# Patient Record
Sex: Male | Born: 1970 | Race: Black or African American | Hispanic: No | Marital: Married | Smoking: Never smoker
Health system: Southern US, Community
[De-identification: ages and names within clinical notes are randomized; demographics above are authoritative.]

## PROBLEM LIST (undated history)

## (undated) DIAGNOSIS — E7800 Pure hypercholesterolemia, unspecified: Secondary | ICD-10-CM

## (undated) HISTORY — PX: VASECTOMY: SHX75

---

## 2001-05-04 ENCOUNTER — Ambulatory Visit (HOSPITAL_COMMUNITY): Admission: RE | Admit: 2001-05-04 | Discharge: 2001-05-04 | Payer: Self-pay | Admitting: Urology

## 2001-12-22 ENCOUNTER — Encounter: Payer: Self-pay | Admitting: Family Medicine

## 2001-12-22 ENCOUNTER — Ambulatory Visit (HOSPITAL_COMMUNITY): Admission: RE | Admit: 2001-12-22 | Discharge: 2001-12-22 | Payer: Self-pay | Admitting: Family Medicine

## 2001-12-28 ENCOUNTER — Encounter: Payer: Self-pay | Admitting: Family Medicine

## 2001-12-28 ENCOUNTER — Ambulatory Visit (HOSPITAL_COMMUNITY): Admission: RE | Admit: 2001-12-28 | Discharge: 2001-12-28 | Payer: Self-pay | Admitting: Family Medicine

## 2002-02-21 ENCOUNTER — Ambulatory Visit (HOSPITAL_COMMUNITY): Admission: RE | Admit: 2002-02-21 | Discharge: 2002-02-21 | Payer: Self-pay | Admitting: Family Medicine

## 2002-02-21 ENCOUNTER — Encounter: Payer: Self-pay | Admitting: Family Medicine

## 2002-10-02 ENCOUNTER — Encounter: Payer: Self-pay | Admitting: Emergency Medicine

## 2002-10-02 ENCOUNTER — Emergency Department (HOSPITAL_COMMUNITY): Admission: EM | Admit: 2002-10-02 | Discharge: 2002-10-02 | Payer: Self-pay | Admitting: Emergency Medicine

## 2003-11-21 ENCOUNTER — Emergency Department (HOSPITAL_COMMUNITY): Admission: EM | Admit: 2003-11-21 | Discharge: 2003-11-21 | Payer: Self-pay | Admitting: Emergency Medicine

## 2004-02-03 ENCOUNTER — Ambulatory Visit (HOSPITAL_COMMUNITY): Admission: RE | Admit: 2004-02-03 | Discharge: 2004-02-03 | Payer: Self-pay | Admitting: Family Medicine

## 2004-09-03 ENCOUNTER — Ambulatory Visit (HOSPITAL_COMMUNITY): Admission: RE | Admit: 2004-09-03 | Discharge: 2004-09-03 | Payer: Self-pay | Admitting: Family Medicine

## 2005-02-08 ENCOUNTER — Ambulatory Visit (HOSPITAL_COMMUNITY): Admission: RE | Admit: 2005-02-08 | Discharge: 2005-02-08 | Payer: Self-pay | Admitting: Family Medicine

## 2009-01-26 ENCOUNTER — Ambulatory Visit (HOSPITAL_COMMUNITY): Admission: RE | Admit: 2009-01-26 | Discharge: 2009-01-26 | Payer: Self-pay | Admitting: Internal Medicine

## 2009-03-05 ENCOUNTER — Ambulatory Visit (HOSPITAL_COMMUNITY): Admission: RE | Admit: 2009-03-05 | Discharge: 2009-03-05 | Payer: Self-pay | Admitting: Family Medicine

## 2012-07-17 ENCOUNTER — Other Ambulatory Visit (HOSPITAL_COMMUNITY): Payer: Self-pay | Admitting: Family Medicine

## 2012-07-17 DIAGNOSIS — M545 Low back pain, unspecified: Secondary | ICD-10-CM

## 2012-07-19 ENCOUNTER — Ambulatory Visit (HOSPITAL_COMMUNITY)
Admission: RE | Admit: 2012-07-19 | Discharge: 2012-07-19 | Disposition: A | Discharge status: Home / Self Care | Payer: BC Managed Care – PPO | Source: Ambulatory Visit | Attending: Family Medicine | Admitting: Family Medicine

## 2012-07-19 DIAGNOSIS — M545 Low back pain, unspecified: Secondary | ICD-10-CM | POA: Insufficient documentation

## 2012-07-19 DIAGNOSIS — M51379 Other intervertebral disc degeneration, lumbosacral region without mention of lumbar back pain or lower extremity pain: Secondary | ICD-10-CM | POA: Insufficient documentation

## 2012-07-19 DIAGNOSIS — M5137 Other intervertebral disc degeneration, lumbosacral region: Secondary | ICD-10-CM | POA: Insufficient documentation

## 2013-08-09 ENCOUNTER — Encounter (HOSPITAL_COMMUNITY): Payer: Self-pay

## 2013-08-09 ENCOUNTER — Encounter (HOSPITAL_COMMUNITY)
Admission: RE | Admit: 2013-08-09 | Discharge: 2013-08-09 | Disposition: A | Discharge status: Home / Self Care | Payer: BC Managed Care – PPO | Source: Ambulatory Visit | Attending: General Surgery | Admitting: General Surgery

## 2013-08-09 ENCOUNTER — Encounter (HOSPITAL_COMMUNITY): Payer: Self-pay | Admitting: Pharmacy Technician

## 2013-08-09 DIAGNOSIS — Z01812 Encounter for preprocedural laboratory examination: Secondary | ICD-10-CM | POA: Insufficient documentation

## 2013-08-09 HISTORY — DX: Pure hypercholesterolemia, unspecified: E78.00

## 2013-08-09 LAB — CBC WITH DIFFERENTIAL/PLATELET
Eosinophils Absolute: 0.1 10*3/uL (ref 0.0–0.7)
Eosinophils Relative: 1 % (ref 0–5)
Lymphs Abs: 2 10*3/uL (ref 0.7–4.0)
MCV: 88.2 fL (ref 78.0–100.0)
Monocytes Absolute: 0.4 10*3/uL (ref 0.1–1.0)
Monocytes Relative: 7 % (ref 3–12)
RBC: 4.98 MIL/uL (ref 4.22–5.81)

## 2013-08-09 LAB — BASIC METABOLIC PANEL
BUN: 12 mg/dL (ref 6–23)
Chloride: 100 mEq/L (ref 96–112)
GFR calc Af Amer: >90 mL/min (ref >90)
Potassium: 3.8 mEq/L (ref 3.5–5.1)
Sodium: 137 mEq/L (ref 135–145)

## 2013-08-09 NOTE — Patient Instructions (Addendum)
MATHEW POSTIGLIONE  08/09/2013   Your procedure is scheduled on:  08/16/2013   Report to [facility] at  615  AM.  Call this number if you have problems the morning of surgery: [phone]   Remember:   Do not eat food or drink liquids after midnight.   Take these medicines the morning of surgery with A SIP OF WATER: none   Do not wear jewelry, make-up or nail polish.  Do not wear lotions, powders, or perfumes.  Do not shave 48 hours prior to surgery. Men may shave face and neck.  Do not bring valuables to the hospital.  [facility] is not responsible  for any belongings or valuables.  Contacts, dentures or bridgework may not be worn into surgery.  Leave suitcase in the car. After surgery it may be brought to your room.  For patients admitted to the hospital, checkout time is 11:00 AM the day of discharge.   Patients discharged the day of surgery will not be allowed to drive home.  Name and phone number of your driver: family  Special Instructions: Shower using CHG 2 nights before surgery and the night before surgery.  If you shower the day of surgery use CHG.  Use special wash - you have one bottle of CHG for all showers.  You should use approximately 1/3 of the bottle for each shower.   Please read over the following fact sheets that you were given: Pain Booklet, Coughing and Deep Breathing, Surgical Site Infection Prevention, Anesthesia Post-op Instructions and Care and Recovery After Surgery Inguinal Hernia, Adult Muscles help keep everything in the body in its proper place. But if a weak spot in the muscles develops, something can poke through. That is called a hernia. When this happens in the lower part of the belly (abdomen), it is called an inguinal hernia. (It takes its name from a part of the body in this region called the inguinal canal.) A weak spot in the wall of muscles lets some fat or part of the small intestine bulge through. An inguinal hernia can develop at any age. Men  get them more often than women. CAUSES  In adults, an inguinal hernia develops over time.  It can be triggered by:  Suddenly straining the muscles of the lower abdomen.  Lifting heavy objects.  Straining to have a bowel movement. Difficult bowel movements (constipation) can lead to this.  Constant coughing. This may be caused by smoking or lung disease.  Being overweight.  Being pregnant.  Working at a job that requires long periods of standing or heavy lifting.  Having had an inguinal hernia before. One type can be an emergency situation. It is called a strangulated inguinal hernia. It develops if part of the small intestine slips through the weak spot and cannot get back into the abdomen. The blood supply can be cut off. If that happens, part of the intestine may die. This situation requires emergency surgery. SYMPTOMS  Often, a small inguinal hernia has no symptoms. It is found when a healthcare provider does a physical exam. Larger hernias usually have symptoms.   In adults, symptoms may include:  A lump in the groin. This is easier to see when the person is standing. It might disappear when lying down.  In men, a lump in the scrotum.  Pain or burning in the groin. This occurs especially when lifting, straining or coughing.  A dull ache or feeling of pressure in the  groin.  Signs of a strangulated hernia can include:  A bulge in the groin that becomes very painful and tender to the touch.  A bulge that turns red or purple.  Fever, nausea and vomiting.  Inability to have a bowel movement or to pass gas. DIAGNOSIS  To decide if you have an inguinal hernia, a healthcare provider will probably do a physical examination.  This will include asking questions about any symptoms you have noticed.  The healthcare provider might feel the groin area and ask you to cough. If an inguinal hernia is felt, the healthcare provider may try to slide it back into the  abdomen.  Usually no other tests are needed. TREATMENT  Treatments can vary. The size of the hernia makes a difference. Options include:  Watchful waiting. This is often suggested if the hernia is small and you have had no symptoms.  No medical procedure will be done unless symptoms develop.  You will need to watch closely for symptoms. If any occur, contact your healthcare provider right away.  Surgery. This is used if the hernia is larger or you have symptoms.  Open surgery. This is usually an outpatient procedure (you will not stay overnight in a hospital). An cut (incision) is made through the skin in the groin. The hernia is put back inside the abdomen. The weak area in the muscles is then repaired by herniorrhaphy or hernioplasty. Herniorrhaphy: in this type of surgery, the weak muscles are sewn back together. Hernioplasty: a patch or mesh is used to close the weak area in the abdominal wall.  Laparoscopy. In this procedure, a surgeon makes small incisions. A thin tube with a tiny video camera (called a laparoscope) is put into the abdomen. The surgeon repairs the hernia with mesh by looking with the video camera and using two long instruments. HOME CARE INSTRUCTIONS   After surgery to repair an inguinal hernia:  You will need to take pain medicine prescribed by your healthcare provider. Follow all directions carefully.  You will need to take care of the wound from the incision.  Your activity will be restricted for awhile. This will probably include no heavy lifting for several weeks. You also should not do anything too active for a few weeks. When you can return to work will depend on the type of job that you have.  During "watchful waiting" periods, you should:  Maintain a healthy weight.  Eat a diet high in fiber (fruits, vegetables and whole grains).  Drink plenty of fluids to avoid constipation. This means drinking enough water and other liquids to keep your urine clear  or pale yellow.  Do not lift heavy objects.  Do not stand for long periods of time.  Quit smoking. This should keep you from developing a frequent cough. SEEK MEDICAL CARE IF:   A bulge develops in your groin area.  You feel pain, a burning sensation or pressure in the groin. This might be worse if you are lifting or straining.  You develop a fever of more than 100.5 F (38.1 C). SEEK IMMEDIATE MEDICAL CARE IF:   Pain in the groin increases suddenly.  A bulge in the groin gets bigger suddenly and does not go down.  For men, there is sudden pain in the scrotum. Or, the size of the scrotum increases.  A bulge in the groin area becomes red or purple and is painful to touch.  You have nausea or vomiting that does not go away.  You  feel your heart beating much faster than normal.  You cannot have a bowel movement or pass gas.  You develop a fever of more than 102.0 F (38.9 C). Document Released: 04/23/2009 Document Revised: 02/27/2012 Document Reviewed: 04/23/2009 [facility] Patient Information 2014 [place], [place]. PATIENT INSTRUCTIONS POST-ANESTHESIA  IMMEDIATELY FOLLOWING SURGERY:  Do not drive or operate machinery for the first twenty four hours after surgery.  Do not make any important decisions for twenty four hours after surgery or while taking narcotic pain medications or sedatives.  If you develop intractable nausea and vomiting or a severe headache please notify your doctor immediately.  FOLLOW-UP:  Please make an appointment with your surgeon as instructed. You do not need to follow up with anesthesia unless specifically instructed to do so.  WOUND CARE INSTRUCTIONS (if applicable):  Keep a dry clean dressing on the anesthesia/puncture wound site if there is drainage.  Once the wound has quit draining you may leave it open to air.  Generally you should leave the bandage intact for twenty four hours unless there is drainage.  If the epidural site drains for more than  36-48 hours please call the anesthesia department.  QUESTIONS?:  Please feel free to call your physician or the hospital operator if you have any questions, and they will be happy to assist you.

## 2013-08-15 NOTE — H&P (Signed)
  NTS SOAP Note  Vital Signs:  Vitals as of: 07/16/2013: Systolic 138: Diastolic 79: Heart Rate 54: Temp 97.35F: Height 25ft 0in: Weight 237Lbs 0 Ounces: BMI 32.14  BMI : 32.14 kg/m2  Subjective: This 42 Years 5 Months old Male presents for of  right groin bulge  and pain. Patient noted similar symptoms over the last year. It has slowly increased in size. No signs or symptoms of incarceration or strangulation the  Review of Symptoms:  Constitutional:unremarkable   Head:unremarkable    Eyes:unremarkable   Nose/Mouth/Throat:unremarkable Cardiovascular:  unremarkable   Respiratory:unremarkable   Gastrointestinal:  unremarkable   as per history of present illness Genitourinary:unremarkable     Musculoskeletal:unremarkable   Skin:unremarkable Breast:unremarkable   Hematolgic/Lymphatic:unremarkable     Allergic/Immunologic:unremarkable     Past Medical History:  Obtained     Past Medical History  Surgical History: none Medical Problems: none Psychiatric History: none Allergies: no known drug allergies Medications: none   Social History:Obtained  Social History  Preferred Language: English Race:  Black or African American Ethnicity: Not Hispanic / Latino Age: 42 Years 11 Months Marital Status:  M Alcohol: none Recreational drug(s): none   Smoking Status: Never smoker reviewed on 07/16/2013 Functional Status reviewed on mm/dd/yyyy ------------------------------------------------ Bathing: Normal Cooking: Normal Dressing: Normal Driving: Normal Eating: Normal Managing Meds: Normal Oral Care: Normal Shopping: Normal Toileting: Normal Transferring: Normal Walking: Normal Cognitive Status reviewed on mm/dd/yyyy ------------------------------------------------ Attention: Normal Decision Making: Normal Language: Normal Memory: Normal Motor: Normal Perception: Normal Problem Solving: Normal Visual and Spatial:  Normal   Family History:Obtained    Family Health History Mother  Father  Other Family Member, Living; Healthy; noncontributory    Objective Information: General:  Well appearing, well nourished in no distress. Skin:     no rash or prominent lesions Head:Atraumatic; no masses; no abnormalities Eyes:  conjunctiva clear, EOM intact, PERRL Mouth:  Mucous membranes moist, no mucosal lesions. Neck:  Supple without lymphadenopathy.  Heart:  RRR, no murmur Lungs:    CTA bilaterally, no wheezes, rhonchi, rales.  Breathing unlabored. Abdomen:Soft, NT/ND, no HSM, no masses. reducible right inguinal hernia. Some laxity in the left groin.  Assessment:    Plan:  Right inguinal hernia. Risks benefits alternatives of repair were discussed at length the patient. He will schedule at his convenience. Signs and symptoms of incarceration and strangulation were discussed the patient is aware to proceed to the emergency department should these occur.  Patient Education:Alternative treatments to surgery were discussed with patient (and family).  Risks and benefits  of procedure were fully explained to the patient (and family) who gave informed consent. Patient/family questions were addressed.  Follow-up:Pending Surgery,PRN

## 2013-08-16 ENCOUNTER — Ambulatory Visit (HOSPITAL_COMMUNITY)
Admission: RE | Admit: 2013-08-16 | Discharge: 2013-08-16 | Disposition: A | Discharge status: Home / Self Care | Payer: BC Managed Care – PPO | Source: Ambulatory Visit | Attending: General Surgery | Admitting: General Surgery

## 2013-08-16 ENCOUNTER — Encounter (HOSPITAL_COMMUNITY)
Admission: RE | Disposition: A | Discharge status: Home / Self Care | Payer: Self-pay | Source: Ambulatory Visit | Attending: General Surgery

## 2013-08-16 ENCOUNTER — Encounter (HOSPITAL_COMMUNITY): Payer: Self-pay | Admitting: Anesthesiology

## 2013-08-16 ENCOUNTER — Encounter (HOSPITAL_COMMUNITY): Payer: Self-pay | Admitting: *Deleted

## 2013-08-16 ENCOUNTER — Ambulatory Visit (HOSPITAL_COMMUNITY): Payer: BC Managed Care – PPO | Admitting: Anesthesiology

## 2013-08-16 DIAGNOSIS — K4090 Unilateral inguinal hernia, without obstruction or gangrene, not specified as recurrent: Secondary | ICD-10-CM | POA: Insufficient documentation

## 2013-08-16 DIAGNOSIS — Z6832 Body mass index (BMI) 32.0-32.9, adult: Secondary | ICD-10-CM | POA: Insufficient documentation

## 2013-08-16 HISTORY — PX: INGUINAL HERNIA REPAIR: SHX194

## 2013-08-16 SURGERY — REPAIR, HERNIA, INGUINAL, ADULT
Anesthesia: General | Site: Groin | Laterality: Right | Wound class: Clean

## 2013-08-16 MED ORDER — PROPOFOL 10 MG/ML IV EMUL
INTRAVENOUS | Status: AC
  Filled 2013-08-16: qty 20

## 2013-08-16 MED ORDER — LACTATED RINGERS IV SOLN
INTRAVENOUS | Status: DC
  Administered 2013-08-16: 07:00:00 via INTRAVENOUS

## 2013-08-16 MED ORDER — CELECOXIB 100 MG PO CAPS
400.0000 mg | ORAL_CAPSULE | Freq: Every day | ORAL | Status: AC
  Administered 2013-08-16: 400 mg via ORAL

## 2013-08-16 MED ORDER — MIDAZOLAM HCL 2 MG/2ML IJ SOLN
INTRAMUSCULAR | Status: AC
  Filled 2013-08-16: qty 2

## 2013-08-16 MED ORDER — FENTANYL CITRATE 0.05 MG/ML IJ SOLN
INTRAMUSCULAR | Status: DC | PRN
  Administered 2013-08-16: 25 ug via INTRAVENOUS
  Administered 2013-08-16 (×4): 50 ug via INTRAVENOUS
  Administered 2013-08-16: 25 ug via INTRAVENOUS

## 2013-08-16 MED ORDER — FENTANYL CITRATE 0.05 MG/ML IJ SOLN
25.0000 ug | INTRAMUSCULAR | Status: DC | PRN
  Administered 2013-08-16 (×2): 50 ug via INTRAVENOUS

## 2013-08-16 MED ORDER — CEFAZOLIN SODIUM-DEXTROSE 2-3 GM-% IV SOLR
INTRAVENOUS | Status: AC
  Filled 2013-08-16: qty 50

## 2013-08-16 MED ORDER — MEPERIDINE HCL 50 MG/ML IJ SOLN
INTRAMUSCULAR | Status: AC
  Administered 2013-08-16: 12.5 mg
  Filled 2013-08-16: qty 1

## 2013-08-16 MED ORDER — ONDANSETRON HCL 4 MG/2ML IJ SOLN
4.0000 mg | Freq: Once | INTRAMUSCULAR | Status: AC
  Administered 2013-08-16: 4 mg via INTRAVENOUS

## 2013-08-16 MED ORDER — MEPERIDINE HCL 25 MG/ML IJ SOLN: 12.5000 mg | Freq: Once | INTRAMUSCULAR | Status: AC

## 2013-08-16 MED ORDER — ENOXAPARIN SODIUM 40 MG/0.4ML ~~LOC~~ SOLN
40.0000 mg | Freq: Once | SUBCUTANEOUS | Status: AC
  Administered 2013-08-16: 40 mg via SUBCUTANEOUS

## 2013-08-16 MED ORDER — LIDOCAINE HCL 2 % EX GEL
CUTANEOUS | Status: AC
  Filled 2013-08-16: qty 30

## 2013-08-16 MED ORDER — SODIUM CHLORIDE 0.9 % IR SOLN
Status: DC | PRN
  Administered 2013-08-16: 1000 mL

## 2013-08-16 MED ORDER — MIDAZOLAM HCL 2 MG/2ML IJ SOLN
1.0000 mg | INTRAMUSCULAR | Status: DC | PRN
  Administered 2013-08-16: 2 mg via INTRAVENOUS

## 2013-08-16 MED ORDER — CELECOXIB 100 MG PO CAPS
ORAL_CAPSULE | ORAL | Status: AC
  Filled 2013-08-16: qty 4

## 2013-08-16 MED ORDER — ENOXAPARIN SODIUM 40 MG/0.4ML ~~LOC~~ SOLN
SUBCUTANEOUS | Status: AC
  Filled 2013-08-16: qty 0.4

## 2013-08-16 MED ORDER — PROPOFOL 10 MG/ML IV BOLUS
INTRAVENOUS | Status: DC | PRN
  Administered 2013-08-16: 200 mg via INTRAVENOUS

## 2013-08-16 MED ORDER — BUPIVACAINE HCL (PF) 0.5 % IJ SOLN
INTRAMUSCULAR | Status: AC
  Filled 2013-08-16: qty 30

## 2013-08-16 MED ORDER — HYDROCODONE-ACETAMINOPHEN 5-325 MG PO TABS: 1.0000 | ORAL_TABLET | ORAL | Status: DC | PRN

## 2013-08-16 MED ORDER — CHLORHEXIDINE GLUCONATE 4 % EX LIQD: 1.0000 "application " | Freq: Once | CUTANEOUS | Status: DC

## 2013-08-16 MED ORDER — FENTANYL CITRATE 0.05 MG/ML IJ SOLN
INTRAMUSCULAR | Status: AC
  Filled 2013-08-16: qty 5

## 2013-08-16 MED ORDER — LIDOCAINE HCL (CARDIAC) 20 MG/ML IV SOLN
INTRAVENOUS | Status: DC | PRN
  Administered 2013-08-16: 30 mg via INTRAVENOUS

## 2013-08-16 MED ORDER — FENTANYL CITRATE 0.05 MG/ML IJ SOLN
INTRAMUSCULAR | Status: AC
  Filled 2013-08-16: qty 2

## 2013-08-16 MED ORDER — ONDANSETRON HCL 4 MG/2ML IJ SOLN: 4.0000 mg | Freq: Once | INTRAMUSCULAR | Status: DC | PRN

## 2013-08-16 MED ORDER — BUPIVACAINE HCL (PF) 0.5 % IJ SOLN
INTRAMUSCULAR | Status: DC | PRN
  Administered 2013-08-16: 10 mL

## 2013-08-16 MED ORDER — CEFAZOLIN SODIUM-DEXTROSE 2-3 GM-% IV SOLR
2.0000 g | INTRAVENOUS | Status: AC
  Administered 2013-08-16: 2 g via INTRAVENOUS

## 2013-08-16 MED ORDER — ONDANSETRON HCL 4 MG/2ML IJ SOLN
INTRAMUSCULAR | Status: AC
  Filled 2013-08-16: qty 2

## 2013-08-16 SURGICAL SUPPLY — 38 items
APL SKNCLS STERI-STRIP NONHPOA (GAUZE/BANDAGES/DRESSINGS) ×1
BAG HAMPER (MISCELLANEOUS) ×2 IMPLANT
BENZOIN TINCTURE PRP APPL 2/3 (GAUZE/BANDAGES/DRESSINGS) ×2 IMPLANT
CLOTH BEACON ORANGE TIMEOUT ST (SAFETY) ×2 IMPLANT
COVER LIGHT HANDLE STERIS (MISCELLANEOUS) ×4 IMPLANT
DECANTER SPIKE VIAL GLASS SM (MISCELLANEOUS) ×2 IMPLANT
DRAIN PENROSE 18X.75 LTX STRL (MISCELLANEOUS) ×2 IMPLANT
DURAPREP 26ML APPLICATOR (WOUND CARE) ×2 IMPLANT
ELECT REM PT RETURN 9FT ADLT (ELECTROSURGICAL) ×2
ELECTRODE REM PT RTRN 9FT ADLT (ELECTROSURGICAL) ×1 IMPLANT
FORMALIN 10 PREFIL 120ML (MISCELLANEOUS) ×2 IMPLANT
GLOVE BIOGEL PI IND STRL 7.0 (GLOVE) ×3 IMPLANT
GLOVE BIOGEL PI IND STRL 7.5 (GLOVE) ×1 IMPLANT
GLOVE BIOGEL PI IND STRL 8 (GLOVE) ×1 IMPLANT
GLOVE BIOGEL PI INDICATOR 7.0 (GLOVE) ×3
GLOVE BIOGEL PI INDICATOR 7.5 (GLOVE) ×1
GLOVE BIOGEL PI INDICATOR 8 (GLOVE) ×1
GLOVE ECLIPSE 7.0 STRL STRAW (GLOVE) ×4 IMPLANT
GLOVE EXAM NITRILE LRG STRL (GLOVE) ×2 IMPLANT
GOWN STRL REIN XL XLG (GOWN DISPOSABLE) ×8 IMPLANT
INST SET MINOR GENERAL (KITS) ×2 IMPLANT
KIT ROOM TURNOVER APOR (KITS) ×2 IMPLANT
MANIFOLD NEPTUNE II (INSTRUMENTS) ×2 IMPLANT
MESH HERNIA 1.6X1.9 PLUG LRG (Mesh General) ×1 IMPLANT
MESH HERNIA PLUG LRG (Mesh General) ×1 IMPLANT
NS IRRIG 1000ML POUR BTL (IV SOLUTION) ×2 IMPLANT
PACK MINOR (CUSTOM PROCEDURE TRAY) ×2 IMPLANT
PAD ARMBOARD 7.5X6 YLW CONV (MISCELLANEOUS) ×2 IMPLANT
SET BASIN LINEN APH (SET/KITS/TRAYS/PACK) ×2 IMPLANT
STRIP CLOSURE SKIN 1/2X4 (GAUZE/BANDAGES/DRESSINGS) ×2 IMPLANT
SUT ETHIBOND NAB MO 7 #0 18IN (SUTURE) ×2 IMPLANT
SUT MNCRL AB 4-0 PS2 18 (SUTURE) ×2 IMPLANT
SUT VIC AB 2-0 CT1 27 (SUTURE) ×1
SUT VIC AB 2-0 CT1 TAPERPNT 27 (SUTURE) ×1 IMPLANT
SUT VIC AB 3-0 SH 27 (SUTURE) ×1
SUT VIC AB 3-0 SH 27X BRD (SUTURE) ×1 IMPLANT
SYR BULB IRRIGATION 50ML (SYRINGE) ×2 IMPLANT
SYR CONTROL 10ML LL (SYRINGE) ×2 IMPLANT

## 2013-08-16 NOTE — Op Note (Signed)
Patient:  Jeffery Orozco  DOB:  Feb 17, 1971  MRN:  [id number]   Preop Diagnosis:  Right inguinal hernia  Postop Diagnosis:  The same  Procedure:  Right inguinal hernia repair with mesh  Surgeon:  Dr. Tilford Pillar  Anes:  General endotracheal, 0.5% Sensorcaine plain for local  Indications:  Patient is a 42 year old male presented my office with a history of a notable bulge in the right groin. This was increasing in size and some discomfort. Evaluation was consistent for a right inguinal hernia which is reducible. Risks benefits alternatives of repair were discussed at length patient including but not limited to risk of bleeding, infection, infection of the mesh requiring removal and subsequent repair, paresthesia, ischemic orchiditis, testicular loss, chronic pain. Patient's questions and concerns are addressed patient's consented for the planned procedure.  Procedure note:  Patient is taken to the operator is placed in supine position the or table time the general anesthetic is administered. Once patient was asleep symmetrically intubated by the nurse anesthetist. At this point his abdomen and groin were prepped with DuraPrep solution and draped in standard fashion. Time out was performed. A marking pen is utilized to plan the initial incision. A 10 blade scalpel is utilized to create the initial incision with additional dissection down to subcuticular tissue carried out using a letter cautery including the division of Scarpa's fascia. This dissection is carried out down to the external oblique fascia which is scored with a 15 blade scalpel. It is opened medially to the external inguinal ring with Metzenbaum scissors. At this point careful dissection is utilized to dissect the cord structures from the walls of the canal. A window was created behind the cord structures and 8 Penrose drain is placed to help with elevation and mobilization of the cord structures. At this point the hernia sac is  identified. I continued dissection is noted to be a direct hernia. This is free from the attached cord structures and canal walls and reduced back into the normal cavity. At this point a large mesh plug was inserted to help maintain reduction of the sac. The plug was secured to the edges of the fascial defect with 0 Ethibond suture. The mesh onlay was then brought to the field and was pexed medially to the pubic tubercle, superiorly to the conjoined tendon, inferiorly to the shelving portion inguinal ligament, and laterally the keyhole defect and secured around the cord structures and tucked under the external oblique fascia. At this point was quite pleased with the appearance of the repair. All Ethibond sutures were noted to be well secured. And the wound is irrigated with warm sterile saline. The cord structures and returned back into the canal. The external oblique fascia is reapproximated using a 2-0 Vicryl and running continuous fashion. The local anesthetic is instilled. Scarpa's fascia is reapproximated using a 3-0 Vicryl and running continuous fashion. The skin edges are reapproximated using a 4-0 Monocryl in running subcuticular suture. The skin was washed dried moist dry towel. Benzoin is applied around incision. Half-inch are suture placed. The drapes removed patient left come general anesthetic and stretcher back in stable condition. At the conclusion of procedure all instrument, sponge, needle counts are correct. Patient tolerated procedure extremely well.  Complications:  None  EBL:  Minimal  Specimen:  None

## 2013-08-16 NOTE — Anesthesia Procedure Notes (Signed)
Procedure Name: LMA Insertion Date/Time: 08/16/2013 7:54 AM Performed by: Glynn Octave E Pre-anesthesia Checklist: Patient identified, Patient being monitored, Emergency Drugs available, Timeout performed and Suction available Patient Re-evaluated:Patient Re-evaluated prior to inductionOxygen Delivery Method: Circle System Utilized Preoxygenation: Pre-oxygenation with 100% oxygen Intubation Type: IV induction Ventilation: Mask ventilation without difficulty LMA: LMA inserted LMA Size: 5.0 Number of attempts: 1 Placement Confirmation: positive ETCO2 and breath sounds checked- equal and bilateral

## 2013-08-16 NOTE — Anesthesia Postprocedure Evaluation (Signed)
  Anesthesia Post-op Note  Patient: Jeffery Orozco  Procedure(s) Performed: Procedure(s): HERNIA REPAIR INGUINAL ADULT (Right)  Patient Location: PACU  Anesthesia Type:General  Level of Consciousness: awake and alert   Airway and Oxygen Therapy: Patient Spontanous Breathing and Patient connected to face mask oxygen  Post-op Pain: mild  Post-op Assessment: Post-op Vital signs reviewed, Patient's Cardiovascular Status Stable, Respiratory Function Stable, Patent Airway and No signs of Nausea or vomiting  Post-op Vital Signs: Reviewed and stable  Complications: No apparent anesthesia complications

## 2013-08-16 NOTE — Interval H&P Note (Signed)
History and Physical Interval Note:  08/16/2013 7:42 AM  Jeffery Orozco  has presented today for surgery, with the diagnosis of right inguinal hernia  The various methods of treatment have been discussed with the patient and family. After consideration of risks, benefits and other options for treatment, the patient has consented to  Procedure(s): HERNIA REPAIR INGUINAL ADULT (Right) as a surgical intervention .  The patient's history has been reviewed, patient examined, no change in status, stable for surgery.  I have reviewed the patient's chart and labs.  Questions were answered to the patient's satisfaction.     [name]

## 2013-08-16 NOTE — Transfer of Care (Signed)
Immediate Anesthesia Transfer of Care Note  Patient: Jeffery Orozco  Procedure(s) Performed: Procedure(s): HERNIA REPAIR INGUINAL ADULT (Right)  Patient Location: PACU  Anesthesia Type:General  Level of Consciousness: awake, alert  and oriented  Airway & Oxygen Therapy: Patient Spontanous Breathing and Patient connected to face mask oxygen  Post-op Assessment: Report given to PACU RN  Post vital signs: Reviewed and stable  Complications: No apparent anesthesia complications

## 2013-08-16 NOTE — Anesthesia Preprocedure Evaluation (Signed)
Anesthesia Evaluation  Patient identified by MRN, date of birth, ID band Patient awake    Reviewed: Allergy & Precautions, H&P , NPO status , Patient's Chart, lab work & pertinent test results  Airway Mallampati: II TM Distance: >3 FB     Dental  (+) Teeth Intact   Pulmonary neg pulmonary ROS,  breath sounds clear to auscultation        Cardiovascular negative cardio ROS  Rhythm:Regular Rate:Normal     Neuro/Psych    GI/Hepatic negative GI ROS,   Endo/Other    Renal/GU      Musculoskeletal   Abdominal   Peds  Hematology   Anesthesia Other Findings   Reproductive/Obstetrics                           Anesthesia Physical Anesthesia Plan  ASA: I  Anesthesia Plan: General   Post-op Pain Management:    Induction: Intravenous  Airway Management Planned: LMA  Additional Equipment:   Intra-op Plan:   Post-operative Plan: Extubation in OR  Informed Consent: I have reviewed the patients History and Physical, chart, labs and discussed the procedure including the risks, benefits and alternatives for the proposed anesthesia with the patient or authorized representative who has indicated his/her understanding and acceptance.     Plan Discussed with:   Anesthesia Plan Comments:         Anesthesia Quick Evaluation  

## 2015-08-28 ENCOUNTER — Ambulatory Visit (INDEPENDENT_AMBULATORY_CARE_PROVIDER_SITE_OTHER): Payer: BLUE CROSS/BLUE SHIELD | Admitting: Urology

## 2015-08-28 DIAGNOSIS — N481 Balanitis: Secondary | ICD-10-CM

## 2015-08-28 DIAGNOSIS — N471 Phimosis: Secondary | ICD-10-CM | POA: Diagnosis not present

## 2015-09-03 ENCOUNTER — Ambulatory Visit (INDEPENDENT_AMBULATORY_CARE_PROVIDER_SITE_OTHER): Payer: BLUE CROSS/BLUE SHIELD | Admitting: Otolaryngology

## 2015-09-03 DIAGNOSIS — J310 Chronic rhinitis: Secondary | ICD-10-CM | POA: Diagnosis not present

## 2015-09-03 DIAGNOSIS — J322 Chronic ethmoidal sinusitis: Secondary | ICD-10-CM | POA: Diagnosis not present

## 2015-09-03 DIAGNOSIS — J320 Chronic maxillary sinusitis: Secondary | ICD-10-CM | POA: Diagnosis not present

## 2015-09-17 ENCOUNTER — Ambulatory Visit (INDEPENDENT_AMBULATORY_CARE_PROVIDER_SITE_OTHER): Payer: BLUE CROSS/BLUE SHIELD | Admitting: Otolaryngology

## 2015-10-02 ENCOUNTER — Ambulatory Visit: Payer: BLUE CROSS/BLUE SHIELD | Admitting: Urology

## 2015-10-08 ENCOUNTER — Ambulatory Visit (INDEPENDENT_AMBULATORY_CARE_PROVIDER_SITE_OTHER): Payer: BLUE CROSS/BLUE SHIELD | Admitting: Otolaryngology

## 2015-10-08 DIAGNOSIS — J322 Chronic ethmoidal sinusitis: Secondary | ICD-10-CM

## 2015-10-08 DIAGNOSIS — J320 Chronic maxillary sinusitis: Secondary | ICD-10-CM | POA: Diagnosis not present

## 2015-10-13 ENCOUNTER — Other Ambulatory Visit (INDEPENDENT_AMBULATORY_CARE_PROVIDER_SITE_OTHER): Payer: Self-pay | Admitting: Otolaryngology

## 2015-10-13 DIAGNOSIS — J329 Chronic sinusitis, unspecified: Secondary | ICD-10-CM

## 2015-10-16 ENCOUNTER — Ambulatory Visit (HOSPITAL_COMMUNITY)
Admission: RE | Admit: 2015-10-16 | Discharge: 2015-10-16 | Disposition: A | Discharge status: Home / Self Care | Payer: BLUE CROSS/BLUE SHIELD | Source: Ambulatory Visit | Attending: Otolaryngology | Admitting: Otolaryngology

## 2015-10-16 DIAGNOSIS — J329 Chronic sinusitis, unspecified: Secondary | ICD-10-CM | POA: Diagnosis not present

## 2015-10-16 DIAGNOSIS — R938 Abnormal findings on diagnostic imaging of other specified body structures: Secondary | ICD-10-CM | POA: Insufficient documentation

## 2015-10-22 ENCOUNTER — Ambulatory Visit (INDEPENDENT_AMBULATORY_CARE_PROVIDER_SITE_OTHER): Payer: BLUE CROSS/BLUE SHIELD | Admitting: Otolaryngology

## 2015-10-22 DIAGNOSIS — J322 Chronic ethmoidal sinusitis: Secondary | ICD-10-CM | POA: Diagnosis not present

## 2015-10-22 DIAGNOSIS — J330 Polyp of nasal cavity: Secondary | ICD-10-CM

## 2015-10-22 DIAGNOSIS — J321 Chronic frontal sinusitis: Secondary | ICD-10-CM | POA: Diagnosis not present

## 2015-10-22 DIAGNOSIS — J320 Chronic maxillary sinusitis: Secondary | ICD-10-CM

## 2015-10-28 ENCOUNTER — Other Ambulatory Visit: Payer: Self-pay | Admitting: Otolaryngology

## 2015-11-05 ENCOUNTER — Encounter (HOSPITAL_BASED_OUTPATIENT_CLINIC_OR_DEPARTMENT_OTHER): Payer: Self-pay | Admitting: *Deleted

## 2015-11-17 ENCOUNTER — Ambulatory Visit (HOSPITAL_BASED_OUTPATIENT_CLINIC_OR_DEPARTMENT_OTHER): Payer: BLUE CROSS/BLUE SHIELD | Admitting: Anesthesiology

## 2015-11-17 ENCOUNTER — Ambulatory Visit (HOSPITAL_BASED_OUTPATIENT_CLINIC_OR_DEPARTMENT_OTHER)
Admission: RE | Admit: 2015-11-17 | Discharge: 2015-11-17 | Disposition: A | Discharge status: Home / Self Care | Payer: BLUE CROSS/BLUE SHIELD | Source: Ambulatory Visit | Attending: Otolaryngology | Admitting: Otolaryngology

## 2015-11-17 ENCOUNTER — Encounter (HOSPITAL_BASED_OUTPATIENT_CLINIC_OR_DEPARTMENT_OTHER)
Admission: RE | Disposition: A | Discharge status: Home / Self Care | Payer: Self-pay | Source: Ambulatory Visit | Attending: Otolaryngology

## 2015-11-17 ENCOUNTER — Encounter (HOSPITAL_BASED_OUTPATIENT_CLINIC_OR_DEPARTMENT_OTHER): Payer: Self-pay

## 2015-11-17 DIAGNOSIS — J322 Chronic ethmoidal sinusitis: Secondary | ICD-10-CM | POA: Diagnosis not present

## 2015-11-17 DIAGNOSIS — J320 Chronic maxillary sinusitis: Secondary | ICD-10-CM | POA: Insufficient documentation

## 2015-11-17 DIAGNOSIS — J338 Other polyp of sinus: Secondary | ICD-10-CM | POA: Diagnosis not present

## 2015-11-17 DIAGNOSIS — J342 Deviated nasal septum: Secondary | ICD-10-CM | POA: Insufficient documentation

## 2015-11-17 DIAGNOSIS — J3489 Other specified disorders of nose and nasal sinuses: Secondary | ICD-10-CM | POA: Insufficient documentation

## 2015-11-17 DIAGNOSIS — J321 Chronic frontal sinusitis: Secondary | ICD-10-CM | POA: Insufficient documentation

## 2015-11-17 HISTORY — PX: SINUS ENDO WITH FUSION: SHX5329

## 2015-11-17 HISTORY — PX: SEPTOPLASTY: SHX2393

## 2015-11-17 SURGERY — SEPTOPLASTY, NOSE
Anesthesia: General | Site: Nose

## 2015-11-17 MED ORDER — PROPOFOL 500 MG/50ML IV EMUL
INTRAVENOUS | Status: AC
  Filled 2015-11-17: qty 50

## 2015-11-17 MED ORDER — HYDROMORPHONE HCL 1 MG/ML IJ SOLN
0.2500 mg | INTRAMUSCULAR | Status: DC | PRN
Start: 2015-11-17 — End: 2015-11-17
  Administered 2015-11-17 (×4): 0.5 mg via INTRAVENOUS

## 2015-11-17 MED ORDER — OXYMETAZOLINE HCL 0.05 % NA SOLN
NASAL | Status: AC
  Filled 2015-11-17: qty 15

## 2015-11-17 MED ORDER — HYDROMORPHONE HCL 1 MG/ML IJ SOLN
INTRAMUSCULAR | Status: AC
  Filled 2015-11-17: qty 1

## 2015-11-17 MED ORDER — GLYCOPYRROLATE 0.2 MG/ML IJ SOLN: 0.2000 mg | Freq: Once | INTRAMUSCULAR | Status: DC | PRN

## 2015-11-17 MED ORDER — SUCCINYLCHOLINE CHLORIDE 20 MG/ML IJ SOLN
INTRAMUSCULAR | Status: DC | PRN
  Administered 2015-11-17: 70 mg via INTRAVENOUS
  Administered 2015-11-17: 200 mg via INTRAVENOUS

## 2015-11-17 MED ORDER — OXYCODONE HCL 5 MG PO TABS
ORAL_TABLET | ORAL | Status: AC
  Filled 2015-11-17: qty 1

## 2015-11-17 MED ORDER — OXYCODONE HCL 5 MG PO TABS
5.0000 mg | ORAL_TABLET | Freq: Once | ORAL | Status: AC
  Administered 2015-11-17: 5 mg via ORAL

## 2015-11-17 MED ORDER — DEXAMETHASONE SODIUM PHOSPHATE 10 MG/ML IJ SOLN
INTRAMUSCULAR | Status: AC
  Filled 2015-11-17: qty 1

## 2015-11-17 MED ORDER — OXYMETAZOLINE HCL 0.05 % NA SOLN
NASAL | Status: DC | PRN
  Administered 2015-11-17: 1 via TOPICAL

## 2015-11-17 MED ORDER — MUPIROCIN 2 % EX OINT
TOPICAL_OINTMENT | CUTANEOUS | Status: DC | PRN
  Administered 2015-11-17: 1 via NASAL

## 2015-11-17 MED ORDER — PROMETHAZINE HCL 25 MG/ML IJ SOLN: 6.2500 mg | INTRAMUSCULAR | Status: DC | PRN

## 2015-11-17 MED ORDER — FENTANYL CITRATE (PF) 100 MCG/2ML IJ SOLN
INTRAMUSCULAR | Status: AC
  Filled 2015-11-17: qty 2

## 2015-11-17 MED ORDER — ONDANSETRON HCL 4 MG/2ML IJ SOLN
INTRAMUSCULAR | Status: AC
  Filled 2015-11-17: qty 2

## 2015-11-17 MED ORDER — FENTANYL CITRATE (PF) 100 MCG/2ML IJ SOLN
50.0000 ug | INTRAMUSCULAR | Status: AC | PRN
  Administered 2015-11-17: 50 ug via INTRAVENOUS
  Administered 2015-11-17: 100 ug via INTRAVENOUS
  Administered 2015-11-17: 50 ug via INTRAVENOUS

## 2015-11-17 MED ORDER — FUROSEMIDE 10 MG/ML IJ SOLN
INTRAMUSCULAR | Status: AC
  Filled 2015-11-17: qty 2

## 2015-11-17 MED ORDER — MUPIROCIN 2 % EX OINT
TOPICAL_OINTMENT | CUTANEOUS | Status: AC
  Filled 2015-11-17: qty 22

## 2015-11-17 MED ORDER — MIDAZOLAM HCL 2 MG/2ML IJ SOLN
1.0000 mg | INTRAMUSCULAR | Status: DC | PRN
  Administered 2015-11-17: 2 mg via INTRAVENOUS

## 2015-11-17 MED ORDER — CEFAZOLIN SODIUM-DEXTROSE 2-3 GM-% IV SOLR
INTRAVENOUS | Status: DC | PRN
  Administered 2015-11-17: 2 g via INTRAVENOUS

## 2015-11-17 MED ORDER — MIDAZOLAM HCL 2 MG/2ML IJ SOLN
INTRAMUSCULAR | Status: AC
  Filled 2015-11-17: qty 2

## 2015-11-17 MED ORDER — LIDOCAINE HCL (CARDIAC) 20 MG/ML IV SOLN
INTRAVENOUS | Status: AC
  Filled 2015-11-17: qty 5

## 2015-11-17 MED ORDER — AMOXICILLIN 875 MG PO TABS: 875.0000 mg | ORAL_TABLET | Freq: Two times a day (BID) | ORAL | Status: DC

## 2015-11-17 MED ORDER — LACTATED RINGERS IV SOLN
INTRAVENOUS | Status: DC
  Administered 2015-11-17 (×2): via INTRAVENOUS

## 2015-11-17 MED ORDER — OXYCODONE-ACETAMINOPHEN 5-325 MG PO TABS: 1.0000 | ORAL_TABLET | ORAL | Status: DC | PRN

## 2015-11-17 MED ORDER — SODIUM CHLORIDE 0.9 % IR SOLN
Status: DC | PRN
  Administered 2015-11-17: 500 mL

## 2015-11-17 MED ORDER — LIDOCAINE-EPINEPHRINE 1 %-1:100000 IJ SOLN
INTRAMUSCULAR | Status: DC | PRN
  Administered 2015-11-17: 4 mL

## 2015-11-17 MED ORDER — LIDOCAINE-EPINEPHRINE 1 %-1:100000 IJ SOLN
INTRAMUSCULAR | Status: AC
  Filled 2015-11-17: qty 1

## 2015-11-17 MED ORDER — ONDANSETRON HCL 4 MG/2ML IJ SOLN
INTRAMUSCULAR | Status: DC | PRN
  Administered 2015-11-17: 4 mg via INTRAVENOUS

## 2015-11-17 MED ORDER — SCOPOLAMINE 1 MG/3DAYS TD PT72: 1.0000 | MEDICATED_PATCH | Freq: Once | TRANSDERMAL | Status: DC | PRN

## 2015-11-17 MED ORDER — DEXAMETHASONE SODIUM PHOSPHATE 4 MG/ML IJ SOLN
INTRAMUSCULAR | Status: DC | PRN
  Administered 2015-11-17: 10 mg via INTRAVENOUS

## 2015-11-17 MED ORDER — PROPOFOL 10 MG/ML IV BOLUS
INTRAVENOUS | Status: DC | PRN
  Administered 2015-11-17: 300 mg via INTRAVENOUS

## 2015-11-17 MED ORDER — LIDOCAINE HCL (CARDIAC) 20 MG/ML IV SOLN
INTRAVENOUS | Status: DC | PRN
  Administered 2015-11-17: 100 mg via INTRAVENOUS

## 2015-11-17 SURGICAL SUPPLY — 48 items
ATTRACTOMAT 16X20 MAGNETIC DRP (DRAPES) IMPLANT
BLADE ROTATE RAD 12 4 M4 (BLADE) IMPLANT
BLADE ROTATE RAD 40 4 M4 (BLADE) IMPLANT
BLADE ROTATE TRICUT 4X13 M4 (BLADE) ×3 IMPLANT
BLADE SURG 15 STRL LF DISP TIS (BLADE) IMPLANT
BLADE SURG 15 STRL SS (BLADE)
BLADE TRICUT ROTATE M4 4 5PK (BLADE) IMPLANT
BUR HS RAD FRONTAL 3 (BURR) IMPLANT
CANISTER SUC SOCK COL 7IN (MISCELLANEOUS) ×3 IMPLANT
CANISTER SUCT 1200ML W/VALVE (MISCELLANEOUS) ×3 IMPLANT
COAGULATOR SUCT 8FR VV (MISCELLANEOUS) ×3 IMPLANT
DECANTER SPIKE VIAL GLASS SM (MISCELLANEOUS) IMPLANT
DRSG NASAL KENNEDY LMNT 8CM (GAUZE/BANDAGES/DRESSINGS) IMPLANT
DRSG NASOPORE 8CM (GAUZE/BANDAGES/DRESSINGS) IMPLANT
DRSG TELFA 3X8 NADH (GAUZE/BANDAGES/DRESSINGS) IMPLANT
ELECT REM PT RETURN 9FT ADLT (ELECTROSURGICAL) ×3
ELECTRODE REM PT RTRN 9FT ADLT (ELECTROSURGICAL) ×2 IMPLANT
GLOVE BIO SURGEON STRL SZ7.5 (GLOVE) ×3 IMPLANT
GLOVE SURG SS PI 7.0 STRL IVOR (GLOVE) ×3 IMPLANT
GOWN STRL REUS W/ TWL LRG LVL3 (GOWN DISPOSABLE) ×4 IMPLANT
GOWN STRL REUS W/TWL LRG LVL3 (GOWN DISPOSABLE) ×4
HEMOSTAT SURGICEL 2X14 (HEMOSTASIS) IMPLANT
IV NS 1000ML (IV SOLUTION)
IV NS 1000ML BAXH (IV SOLUTION) IMPLANT
IV NS 500ML (IV SOLUTION) ×1
IV NS 500ML BAXH (IV SOLUTION) ×2 IMPLANT
NEEDLE HYPO 25X1 1.5 SAFETY (NEEDLE) ×3 IMPLANT
NEEDLE SPNL 25GX3.5 QUINCKE BL (NEEDLE) IMPLANT
NS IRRIG 1000ML POUR BTL (IV SOLUTION) ×3 IMPLANT
PACK BASIN DAY SURGERY FS (CUSTOM PROCEDURE TRAY) ×3 IMPLANT
PACK ENT DAY SURGERY (CUSTOM PROCEDURE TRAY) ×3 IMPLANT
SLEEVE SCD COMPRESS KNEE MED (MISCELLANEOUS) ×3 IMPLANT
SOLUTION BUTLER CLEAR DIP (MISCELLANEOUS) ×3 IMPLANT
SPLINT NASAL AIRWAY SILICONE (MISCELLANEOUS) ×3 IMPLANT
SPONGE GAUZE 2X2 8PLY STRL LF (GAUZE/BANDAGES/DRESSINGS) ×3 IMPLANT
SPONGE NEURO XRAY DETECT 1X3 (DISPOSABLE) ×3 IMPLANT
SUT CHROMIC 4 0 P 3 18 (SUTURE) ×3 IMPLANT
SUT PLAIN 4 0 ~~LOC~~ 1 (SUTURE) ×3 IMPLANT
SUT PROLENE 3 0 PS 2 (SUTURE) ×3 IMPLANT
SUT VIC AB 4-0 P-3 18XBRD (SUTURE) IMPLANT
SUT VIC AB 4-0 P3 18 (SUTURE)
TOWEL OR 17X24 6PK STRL BLUE (TOWEL DISPOSABLE) ×3 IMPLANT
TRACKER ENT INSTRUMENT (MISCELLANEOUS) ×3 IMPLANT
TRACKER ENT PATIENT (MISCELLANEOUS) ×3 IMPLANT
TUBE CONNECTING 20X1/4 (TUBING) ×3 IMPLANT
TUBE SALEM SUMP 16 FR W/ARV (TUBING) IMPLANT
TUBING STRAIGHTSHOT EPS 5PK (TUBING) ×3 IMPLANT
YANKAUER SUCT BULB TIP NO VENT (SUCTIONS) ×3 IMPLANT

## 2015-11-17 NOTE — Discharge Instructions (Addendum)

## 2015-11-17 NOTE — Anesthesia Procedure Notes (Signed)
Procedure Name: Intubation Date/Time: 11/17/2015 10:06 AM Performed by: Lieutenant Diego Pre-anesthesia Checklist: Patient identified, Emergency Drugs available, Suction available and Patient being monitored Patient Re-evaluated:Patient Re-evaluated prior to inductionOxygen Delivery Method: Circle System Utilized Preoxygenation: Pre-oxygenation with 100% oxygen Intubation Type: IV induction Ventilation: Mask ventilation without difficulty Laryngoscope Size: Miller and 2 Grade View: Grade I Tube type: Oral Tube size: 8.0 mm Number of attempts: 1 Airway Equipment and Method: Stylet and Oral airway Placement Confirmation: ETT inserted through vocal cords under direct vision,  positive ETCO2 and breath sounds checked- equal and bilateral Secured at: 24 cm Tube secured with: Tape Dental Injury: Teeth and Oropharynx as per pre-operative assessment

## 2015-11-17 NOTE — Anesthesia Postprocedure Evaluation (Signed)
Anesthesia Post Note  Patient: Jeffery Orozco  Procedure(s) Performed: Procedure(s) (LRB): SEPTOPLASTY (N/A) LEFT ENDOSCOPIC TOTAL ETHMOIDECTOMY, LEFT ENDOSCOPIC MAXILLARY ANTROSTOMY, LEFT ENDOSCOPIC FRONTAL RECESS EXPLORATION WITH FUSION NAVIGATION (Left)  Patient location during evaluation: PACU Anesthesia Type: General Level of consciousness: awake and awake and alert Pain management: pain level controlled Vital Signs Assessment: vitals unstable Respiratory status: spontaneous breathing Cardiovascular status: stable Anesthetic complications: no    Last Vitals:  Filed Vitals:   11/17/15 1300 11/17/15 1315  BP: 146/88   Pulse: 85 82  Temp:    Resp: 21 19    Last Pain:  Filed Vitals:   11/17/15 1400  PainSc: 5                  Siraj Dermody,JAMES TERRILL

## 2015-11-17 NOTE — Anesthesia Preprocedure Evaluation (Signed)
Anesthesia Evaluation  Patient identified by MRN, date of birth, ID band Patient awake    Reviewed: Allergy & Precautions, NPO status , Patient's Chart, lab work & pertinent test results  Airway Mallampati: I  TM Distance: >3 FB Neck ROM: Full    Dental  (+) Teeth Intact   Pulmonary neg pulmonary ROS,    breath sounds clear to auscultation       Cardiovascular negative cardio ROS   Rhythm:Regular Rate:Normal     Neuro/Psych negative neurological ROS  negative psych ROS   GI/Hepatic negative GI ROS, Neg liver ROS,   Endo/Other  negative endocrine ROS  Renal/GU negative Renal ROS  negative genitourinary   Musculoskeletal negative musculoskeletal ROS (+)   Abdominal   Peds negative pediatric ROS (+)  Hematology negative hematology ROS (+)   Anesthesia Other Findings   Reproductive/Obstetrics negative OB ROS                             Anesthesia Physical Anesthesia Plan  ASA: I  Anesthesia Plan: General   Post-op Pain Management:    Induction: Intravenous  Airway Management Planned: Oral ETT  Additional Equipment:   Intra-op Plan:   Post-operative Plan: Extubation in OR  Informed Consent: I have reviewed the patients History and Physical, chart, labs and discussed the procedure including the risks, benefits and alternatives for the proposed anesthesia with the patient or authorized representative who has indicated his/her understanding and acceptance.   Dental advisory given  Plan Discussed with: CRNA and Surgeon  Anesthesia Plan Comments:         Anesthesia Quick Evaluation

## 2015-11-17 NOTE — Transfer of Care (Signed)
Immediate Anesthesia Transfer of Care Note  Patient: Jeffery Orozco  Procedure(s) Performed: Procedure(s): SEPTOPLASTY (N/A) LEFT ENDOSCOPIC TOTAL ETHMOIDECTOMY, LEFT ENDOSCOPIC MAXILLARY ANTROSTOMY, LEFT ENDOSCOPIC FRONTAL RECESS EXPLORATION WITH FUSION NAVIGATION (Left)  Patient Location: PACU  Anesthesia Type:General  Level of Consciousness: awake  Airway & Oxygen Therapy: Patient Spontanous Breathing and Patient connected to face mask oxygen  Post-op Assessment: Report given to RN and Post -op Vital signs reviewed and stable  Post vital signs: Reviewed and stable  Last Vitals:  Filed Vitals:   11/17/15 0821 11/17/15 0852  BP: 122/78 135/76  Pulse: 61 75  Temp: 36.7 C 36.8 C  Resp: 20 20    Complications: No apparent anesthesia complications

## 2015-11-17 NOTE — H&P (Signed)
Cc: Chronic nasal obstruction, persistent left rhinosinusitis   HPI:  The patient is a 44 year old male who returns today for his follow-up evaluation.  The patient was last seen 2 weeks ago.  At that time, he was noted to have persistent left-sided rhinosinusitis.  He was treated with multiple courses of antibiotics without improvement in his condition.  He also underwent a paranasal sinus CT scan.  The CT showed severe opacification of the left frontal, maxillary, and ethmoid sinuses.  An apparent mass was noted within the left maxillary sinus, bulging into the left nasal cavity. 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  The patient returns today complaining of persistent left-sided facial pressure, obstruction and foul odor. He denies any fever or visual change.  It should be noted the patient's CT also showed severe nasal septal deviation to the left.    Exam: The nasal cavities were decongested and anesthetised with a combination of oxymetazoline and 4% lidocaine solution.  The flexible scope was inserted into the right nasal cavity.  Endoscopy of the inferior and middle meatus was performed.  Edematous mucosa was noted.  No polyp, mass, or lesion was appreciated.  Olfactory cleft was clear.  Nasopharynx was clear.  Turbinates were hypertrophied but without mass.    The procedure was repeated on the contralateral side. The patient continues to have left-sided rhinosinusitis, with a large amount of purulent drainage within the left nasal cavity.   The patient tolerated the procedure well.  Instructions were given to avoid eating or drinking for 2 hours.    Assessment: 1.  The patient continues to have left-sided rhinosinusitis, with a large amount of purulent drainage within the left nasal cavity.   2.  The patient's paranasal sinus CT scan shows chronic left maxillary, ethmoid, and frontal sinusitis.  A possible neoplasm was also noted within the left maxillary sinus.    Plan: 1.  The CT  images and endoscopy findings are reviewed with the patient.   2.  In light of the above findings, the patient will benefit from undergoing surgical intervention with left-sided endoscopic sinus surgery and septoplasty.  The risks, benefits, alternatives and details of the procedure are reviewed with the patient.  Questions are invited and answered.  3.  The patient would like to proceed with the procedure.   We will schedule the procedure as soon as possible.

## 2015-11-17 NOTE — Op Note (Signed)
DATE OF PROCEDURE: 11/17/2015  OPERATIVE REPORT   SURGEON: Leta Baptist, MD   PREOPERATIVE DIAGNOSES:  1. Severe nasal septal deviation.  2. Chronic left maxillary, frontal, and ethmoid sinusitis 3. Left sinonasal polyps. 4. Chronic nasal obstruction  POSTOPERATIVE DIAGNOSES:  1. Severe nasal septal deviation.  2. Chronic left maxillary, frontal, and ethmoid sinusitis 3. Left sinonasal polyps. 4. Chronic nasal obstruction  PROCEDURE PERFORMED:  1. Septoplasty.  2. Endoscopic left frontal sinusotomy  3. Endoscopic left total ethmoidectomy  4. Endoscopic left maxillary antrostomy with polyp removal  5. FUSION stereotactic image guidance   ANESTHESIA: General endotracheal tube anesthesia.   COMPLICATIONS: None.   ESTIMATED BLOOD LOSS: Less than 100 mL.   INDICATION FOR PROCEDURE: Jeffery Orozco is a 44 y.o. male with a history of chronic left-sided rhinosinusitis and persistent nasal obstruction. He has been experiencing left-sided facial pain and foul odor. The patient was treated with multiple antibiotics, antihistamine, decongestant, steroid nasal spray, and systemic steroids. However, the patient continues to be symptomatic. On examination, the patient was noted to have severe nasal septal deviation to the left, polypoid tissue obstructing the left middle meatus, and persistent purulent drainage from the left paranasal sinuses.   On his CT scan, opacification of the left frontal, ethmoid, and maxillary sinuses were noted. A possible mass was also noted within the left maxillary sinus, bulging into the left nasal cavity. Based on the above findings, the decision was made for the patient to undergo the above-stated procedure. The risks, benefits, alternatives, and details of the procedure were discussed with the patient. Questions were invited and answered. Informed consent was obtained.   DESCRIPTION OF PROCEDURE: The patient was taken to the operating room and placed supine on the  operating table. General endotracheal tube anesthesia was administered by the anesthesiologist. The patient was positioned, and prepped and draped in the standard fashion for nasal surgery. Pledgets soaked with Afrin were placed in both nasal cavities for decongestion. The pledgets were subsequently removed. The above mentioned severe septal deviation was again noted. 1% lidocaine with 1:100,000 epinephrine was injected onto the nasal septum bilaterally. A hemitransfixion incision was made on the left side. The mucosal flap was carefully elevated on the left side. A cartilaginous incision was made 1 cm superior to the caudal margin of the nasal septum. Mucosal flap was also elevated on the right side in the similar fashion. It should be noted that due to the severe septal deviation, the deviated portion of the cartilaginous and bony septum had to be removed in piecemeal fashion. Once the deviated portions were removed, a straight midline septum was achieved. The septum was then quilted with 4-0 plain gut sutures. The hemitransfixion incision was closed with interrupted 4-0 chromic sutures.   Attention was then focused on his left paranasal sinuses. The left middle turbinate was carefully medialized. A large amount of polypoid tissue was noted to be obstructing the left middle meatus. Purulent drainage was noted throughout the left nasal cavity. The left uncinate process was resected with a freer elevator. Polypoid tissue was removed using a combination of Blakesley forceps and microdebrider. The maxillary antrum was then enlarged and entered. The left maxillary sinus was filled with a purulent debris. The left maxillary sinus was copiously irrigated.  The left anterior and posterior ethmoid cavities were then entered. Polypoid tissue was also removed from the ethmoid sinuses. The same procedure was performed on the frontal recess. The frontal opening was enlarged using a combination of microdebrider and Tru-Cut  forceps. The frontal sinus was copiously irrigated with saline solution. Hemostasis was achieved with the Xerogel packing.   The care of the patient was turned over to the anesthesiologist. The patient was awakened from anesthesia without difficulty. The patient was extubated and transferred to the recovery room in good condition.   OPERATIVE FINDINGS: Severe nasal septal deviation, chronic left maxillary, ethmoid, and frontal sinusitis.   SPECIMEN:  Left sinonasal contents  FOLLOWUP CARE: The patient be discharged home once he is awake and alert. The patient will be placed on Percocet 1-2 tablets p.o. q.6 hours p.r.n. pain, and amoxicillin 875 mg p.o. b.i.d. for 5 days. The patient will follow up in my office in approximately 1 week for splint removal.   Jeffery Vantuyl Raynelle Bring, MD

## 2015-11-18 ENCOUNTER — Encounter (HOSPITAL_BASED_OUTPATIENT_CLINIC_OR_DEPARTMENT_OTHER): Payer: Self-pay | Admitting: Otolaryngology

## 2015-11-19 ENCOUNTER — Ambulatory Visit (INDEPENDENT_AMBULATORY_CARE_PROVIDER_SITE_OTHER): Payer: BLUE CROSS/BLUE SHIELD | Admitting: Otolaryngology

## 2015-11-19 DIAGNOSIS — J322 Chronic ethmoidal sinusitis: Secondary | ICD-10-CM

## 2015-11-19 DIAGNOSIS — J321 Chronic frontal sinusitis: Secondary | ICD-10-CM | POA: Diagnosis not present

## 2015-11-19 DIAGNOSIS — J320 Chronic maxillary sinusitis: Secondary | ICD-10-CM

## 2015-11-27 ENCOUNTER — Ambulatory Visit: Payer: BLUE CROSS/BLUE SHIELD | Admitting: Urology

## 2015-12-03 ENCOUNTER — Ambulatory Visit (INDEPENDENT_AMBULATORY_CARE_PROVIDER_SITE_OTHER): Payer: BLUE CROSS/BLUE SHIELD | Admitting: Otolaryngology

## 2015-12-03 DIAGNOSIS — J321 Chronic frontal sinusitis: Secondary | ICD-10-CM | POA: Diagnosis not present

## 2015-12-03 DIAGNOSIS — J320 Chronic maxillary sinusitis: Secondary | ICD-10-CM | POA: Diagnosis not present

## 2015-12-03 DIAGNOSIS — J322 Chronic ethmoidal sinusitis: Secondary | ICD-10-CM | POA: Diagnosis not present

## 2016-01-14 ENCOUNTER — Ambulatory Visit (INDEPENDENT_AMBULATORY_CARE_PROVIDER_SITE_OTHER): Payer: BLUE CROSS/BLUE SHIELD | Admitting: Otolaryngology

## 2016-01-20 ENCOUNTER — Encounter (INDEPENDENT_AMBULATORY_CARE_PROVIDER_SITE_OTHER): Payer: Self-pay | Admitting: *Deleted

## 2016-01-20 ENCOUNTER — Telehealth: Payer: Self-pay

## 2016-01-20 NOTE — Telephone Encounter (Signed)
Pt called and is scheduled for OV with Walden Field, NP on 01/21/2016 at 8:30 AM.

## 2016-01-20 NOTE — Telephone Encounter (Signed)
Pt was referred by Dr. Hilma Favors for screening colonoscopy. However, in chart notes pt has had some rectal bleeding and will need an OV prior to scheduling procedure. LMOM for a return call.

## 2016-01-21 ENCOUNTER — Encounter (INDEPENDENT_AMBULATORY_CARE_PROVIDER_SITE_OTHER): Payer: Self-pay | Admitting: *Deleted

## 2016-01-21 ENCOUNTER — Other Ambulatory Visit: Payer: Self-pay

## 2016-01-21 ENCOUNTER — Ambulatory Visit (INDEPENDENT_AMBULATORY_CARE_PROVIDER_SITE_OTHER): Payer: BLUE CROSS/BLUE SHIELD | Admitting: Nurse Practitioner

## 2016-01-21 ENCOUNTER — Encounter: Payer: Self-pay | Admitting: Nurse Practitioner

## 2016-01-21 VITALS — BP 136/94 | HR 66 | Temp 97.1°F | Ht 72.0 in | Wt 249.4 lb

## 2016-01-21 DIAGNOSIS — K625 Hemorrhage of anus and rectum: Secondary | ICD-10-CM | POA: Diagnosis not present

## 2016-01-21 DIAGNOSIS — K6289 Other specified diseases of anus and rectum: Secondary | ICD-10-CM

## 2016-01-21 MED ORDER — PEG-KCL-NACL-NASULF-NA ASC-C 100 G PO SOLR: 1.0000 | ORAL | Status: DC

## 2016-01-21 NOTE — Progress Notes (Signed)
Primary Care Physician:  Purvis Kilts, MD Primary Gastroenterologist:  Dr. Gala Romney  Chief Complaint  Patient presents with  . Rectal Bleeding  . set up TCS    HPI:   45 year old male presents on referral from primary care for rectal bleeding. PCP notes reviewed. Last saw primary care on February 1 for rectal bleeding which is noted as bright red blood of a minimal amount and spotting on toilet tissue, without melena. Also complained of anal fullness and perianal pain. No record of colonoscopy and her system.  Today he states he first noticed rectal bleeding about 5-6 weeks ago, has tried rectal suppositories/creams with helped minimally. Worse after a bowel movement. Blood is bright red, on the stool and toilet tissue. Denies abdominal pain. Admits rectal pain/irritation intermittently but worse after a bowel movement. Denies fever, chills, unintentional weight loss, change in bowel habits. Has a bowel movement every day. Typically soft and passes without significant straining. Denies chest pain, dyspnea, dizziness, lightheadedness, syncope, near syncope. Denies any other upper or lower GI symptoms.  Past Medical History  Diagnosis Date  . Hypercholesteremia     Past Surgical History  Procedure Laterality Date  . Vasectomy    . Inguinal hernia repair Right 08/16/2013    Procedure: HERNIA REPAIR INGUINAL ADULT;  Surgeon: Donato Heinz, MD;  Location: [facility];  Service: General;  Laterality: Right;  . Septoplasty N/A 11/17/2015    Procedure: SEPTOPLASTY;  Surgeon: Leta Baptist, MD;  Location: [facility];  Service: ENT;  Laterality: N/A;  . Sinus endo with fusion Left 11/17/2015    Procedure: LEFT ENDOSCOPIC TOTAL ETHMOIDECTOMY, LEFT ENDOSCOPIC MAXILLARY ANTROSTOMY, LEFT ENDOSCOPIC FRONTAL RECESS EXPLORATION WITH FUSION NAVIGATION;  Surgeon: Leta Baptist, MD;  Location: [facility];  Service: ENT;  Laterality: Left;    Current Outpatient Prescriptions    Medication Sig Dispense Refill  . atorvastatin (LIPITOR) 20 MG tablet Take 20 mg by mouth daily.    Nicolette Bang 25 MG suppository Reported on 01/21/2016  3  . PROCTOZONE-HC 2.5 % rectal cream Reported on 01/21/2016  2   No current facility-administered medications for this visit.    Allergies as of 01/21/2016  . (No Known Allergies)    No family history on file.  Social History   Social History  . Marital Status: Divorced    Spouse Name: N/A  . Number of Children: N/A  . Years of Education: N/A   Occupational History  . Not on file.   Social History Main Topics  . Smoking status: Never Smoker   . Smokeless tobacco: Not on file  . Alcohol Use: No  . Drug Use: No  . Sexual Activity: Yes    Birth Control/ Protection: None   Other Topics Concern  . Not on file   Social History Narrative    Review of Systems: General: Negative for anorexia, weight loss, fever, chills, fatigue, weakness. Eyes: Negative for vision changes.  ENT: Negative for hoarseness, difficulty swallowing. CV: Negative for chest pain, angina, palpitations, peripheral edema.  Respiratory: Negative for dyspnea at rest, cough, sputum, wheezing.  GI: See history of present illness. Derm: Negative for rash or itching.  Endo: Negative for unusual weight change.  Heme: Negative for bruising or bleeding. Allergy: Negative for rash or hives.    Physical Exam: BP 136/94 mmHg  Pulse 66  Temp(Src) 97.1 F (36.2 C)  Ht 6' (1.829 m)  Wt 249 lb 6.4 oz (113.127 kg)  BMI 33.82  kg/m2 General:   Alert and oriented. Pleasant and cooperative. Well-nourished and well-developed.  Head:  Normocephalic and atraumatic. Eyes:  Without icterus, sclera clear and conjunctiva pink.  Ears:  Normal auditory acuity. Cardiovascular:  S1, S2 present without murmurs appreciated. Extremities without clubbing or edema. Respiratory:  Clear to auscultation bilaterally. No wheezes, rales, or rhonchi. No distress.   Gastrointestinal:  +BS, rounded but soft, non-tender and non-distended. No HSM noted. No guarding or rebound. No masses appreciated.  Rectal:  Deferred  Musculoskalatal:  Symmetrical without gross deformities. Skin:  Intact without significant lesions or rashes. Neurologic:  Alert and oriented x4;  grossly normal neurologically. Psych:  Alert and cooperative. Normal mood and affect. Heme/Lymph/Immune: No excessive bruising noted.    01/21/2016 8:38 AM

## 2016-01-21 NOTE — Assessment & Plan Note (Signed)
Patient with rectal bleeding after bowel movements. Not overtly constipated. Also with rectal pain and pressure as noted below. Otherwise he is asymptomatic from a GI standpoint. He is tried rectal creams and suppositories which do not seem to be very effective. At this point given that he is less in 2 years away from his initial screening colonoscopy age we'll proceed with a colonoscopy to further evaluate. If he is a hemorrhoid banding candidate he seems to be interested and potential hemorrhoid banding as an option versus surgery for resection. Return for follow-up based on post procedure recommendations.  Proceed with TCS with Dr. Gala Romney in near future: the risks, benefits, and alternatives have been discussed with the patient in detail. The patient states understanding and desires to proceed.  The patient is not on any anticoagulants, anxiolytics, chronic pain medications, or antidepressants. Conscious sedation should be adequate for his procedure.

## 2016-01-21 NOTE — Assessment & Plan Note (Signed)
Rectal pain and rectal pressure noted throughout the day intermittently. Seems to be worse after a bowel movement. Also with rectal bleeding as noted above. Given his current presentation and his age relative to age of first initial screening colonoscopy we'll proceed with colonoscopy as noted above.

## 2016-01-21 NOTE — Progress Notes (Signed)
CC'ED TO PCP 

## 2016-01-21 NOTE — Patient Instructions (Signed)
1. We will schedule your procedure for you. 2. Further recommendations to be based on the results of your procedure. 

## 2016-02-01 ENCOUNTER — Ambulatory Visit (INDEPENDENT_AMBULATORY_CARE_PROVIDER_SITE_OTHER): Payer: BLUE CROSS/BLUE SHIELD | Admitting: Internal Medicine

## 2016-02-08 ENCOUNTER — Other Ambulatory Visit: Payer: Self-pay

## 2016-02-08 DIAGNOSIS — K625 Hemorrhage of anus and rectum: Secondary | ICD-10-CM

## 2016-02-10 ENCOUNTER — Encounter (HOSPITAL_COMMUNITY)
Admission: RE | Disposition: A | Discharge status: Home / Self Care | Payer: Self-pay | Source: Ambulatory Visit | Attending: Internal Medicine

## 2016-02-10 ENCOUNTER — Encounter (HOSPITAL_COMMUNITY): Payer: Self-pay | Admitting: *Deleted

## 2016-02-10 ENCOUNTER — Ambulatory Visit (HOSPITAL_COMMUNITY)
Admission: RE | Admit: 2016-02-10 | Discharge: 2016-02-10 | Disposition: A | Discharge status: Home / Self Care | Payer: BLUE CROSS/BLUE SHIELD | Source: Ambulatory Visit | Attending: Internal Medicine | Admitting: Internal Medicine

## 2016-02-10 DIAGNOSIS — Z860100 Personal history of colon polyps, unspecified: Secondary | ICD-10-CM | POA: Insufficient documentation

## 2016-02-10 DIAGNOSIS — K5903 Drug induced constipation: Secondary | ICD-10-CM | POA: Insufficient documentation

## 2016-02-10 DIAGNOSIS — Z79899 Other long term (current) drug therapy: Secondary | ICD-10-CM | POA: Insufficient documentation

## 2016-02-10 DIAGNOSIS — D123 Benign neoplasm of transverse colon: Secondary | ICD-10-CM | POA: Insufficient documentation

## 2016-02-10 DIAGNOSIS — K921 Melena: Secondary | ICD-10-CM | POA: Insufficient documentation

## 2016-02-10 DIAGNOSIS — K621 Rectal polyp: Secondary | ICD-10-CM | POA: Diagnosis not present

## 2016-02-10 DIAGNOSIS — D128 Benign neoplasm of rectum: Secondary | ICD-10-CM | POA: Insufficient documentation

## 2016-02-10 DIAGNOSIS — K5901 Slow transit constipation: Secondary | ICD-10-CM

## 2016-02-10 DIAGNOSIS — K5730 Diverticulosis of large intestine without perforation or abscess without bleeding: Secondary | ICD-10-CM | POA: Insufficient documentation

## 2016-02-10 DIAGNOSIS — D120 Benign neoplasm of cecum: Secondary | ICD-10-CM | POA: Diagnosis not present

## 2016-02-10 DIAGNOSIS — E7800 Pure hypercholesterolemia, unspecified: Secondary | ICD-10-CM | POA: Diagnosis not present

## 2016-02-10 DIAGNOSIS — K649 Unspecified hemorrhoids: Secondary | ICD-10-CM | POA: Diagnosis not present

## 2016-02-10 DIAGNOSIS — K635 Polyp of colon: Secondary | ICD-10-CM

## 2016-02-10 DIAGNOSIS — K625 Hemorrhage of anus and rectum: Secondary | ICD-10-CM

## 2016-02-10 HISTORY — PX: COLONOSCOPY: SHX5424

## 2016-02-10 SURGERY — COLONOSCOPY
Anesthesia: Moderate Sedation

## 2016-02-10 MED ORDER — MEPERIDINE HCL 100 MG/ML IJ SOLN
INTRAMUSCULAR | Status: DC | PRN
  Administered 2016-02-10 (×2): 50 mg via INTRAVENOUS

## 2016-02-10 MED ORDER — ONDANSETRON HCL 4 MG/2ML IJ SOLN
INTRAMUSCULAR | Status: DC | PRN
  Administered 2016-02-10: 4 mg via INTRAVENOUS

## 2016-02-10 MED ORDER — MEPERIDINE HCL 100 MG/ML IJ SOLN
INTRAMUSCULAR | Status: AC
  Filled 2016-02-10: qty 2

## 2016-02-10 MED ORDER — ONDANSETRON HCL 4 MG/2ML IJ SOLN
INTRAMUSCULAR | Status: AC
  Filled 2016-02-10: qty 2

## 2016-02-10 MED ORDER — MIDAZOLAM HCL 5 MG/5ML IJ SOLN
INTRAMUSCULAR | Status: AC
  Filled 2016-02-10: qty 10

## 2016-02-10 MED ORDER — MIDAZOLAM HCL 5 MG/5ML IJ SOLN
INTRAMUSCULAR | Status: DC | PRN
  Administered 2016-02-10: 2 mg via INTRAVENOUS
  Administered 2016-02-10: 1 mg via INTRAVENOUS
  Administered 2016-02-10: 2 mg via INTRAVENOUS

## 2016-02-10 MED ORDER — SODIUM CHLORIDE 0.9 % IV SOLN
INTRAVENOUS | Status: DC
  Administered 2016-02-10: 12:00:00 via INTRAVENOUS

## 2016-02-10 NOTE — Interval H&P Note (Signed)
History and Physical Interval Note:  02/10/2016 12:02 PM  Jeffery Orozco  has presented today for surgery, with the diagnosis of rectal bleeding  The various methods of treatment have been discussed with the patient and family. After consideration of risks, benefits and other options for treatment, the patient has consented to  Procedure(s) with comments: COLONOSCOPY (N/A) - 100 as a surgical intervention .  The patient's history has been reviewed, patient examined, no change in status, stable for surgery.  I have reviewed the patient's chart and labs.  Questions were answered to the patient's satisfaction.     Jeffery Orozco   No change. Diagnostic colonoscopy per plan.  The risks, benefits, limitations, alternatives and imponderables have been reviewed with the patient. Questions have been answered. All parties are agreeable.

## 2016-02-10 NOTE — Discharge Instructions (Signed)
Colonoscopy Discharge Instructions  Read the instructions outlined below and refer to this sheet in the next few weeks. These discharge instructions provide you with general information on caring for yourself after you leave the hospital. Your doctor may also give you specific instructions. While your treatment has been planned according to the most current medical practices available, unavoidable complications occasionally occur. If you have any problems or questions after discharge, call Dr. Gala Romney at [phone]. ACTIVITY  You may resume your regular activity, but move at a slower pace for the next 24 hours.   Take frequent rest periods for the next 24 hours.   Walking will help get rid of the air and reduce the bloated feeling in your belly (abdomen).   No driving for 24 hours (because of the medicine (anesthesia) used during the test).    Do not sign any important legal documents or operate any machinery for 24 hours (because of the anesthesia used during the test).  NUTRITION  Drink plenty of fluids.   You may resume your normal diet as instructed by your doctor.   Begin with a light meal and progress to your normal diet. Heavy or fried foods are harder to digest and may make you feel sick to your stomach (nauseated).   Avoid alcoholic beverages for 24 hours or as instructed.  MEDICATIONS  You may resume your normal medications unless your doctor tells you otherwise.  WHAT YOU CAN EXPECT TODAY  Some feelings of bloating in the abdomen.   Passage of more gas than usual.   Spotting of blood in your stool or on the toilet paper.  IF YOU HAD POLYPS REMOVED DURING THE COLONOSCOPY:  No aspirin products for 7 days or as instructed.   No alcohol for 7 days or as instructed.   Eat a soft diet for the next 24 hours.  FINDING OUT THE RESULTS OF YOUR TEST Not all test results are available during your visit. If your test results are not back during the visit, make an appointment  with your caregiver to find out the results. Do not assume everything is normal if you have not heard from your caregiver or the medical facility. It is important for you to follow up on all of your test results.  SEEK IMMEDIATE MEDICAL ATTENTION IF:  You have more than a spotting of blood in your stool.   Your belly is swollen (abdominal distention).   You are nauseated or vomiting.   You have a temperature over 101.   You have abdominal pain or discomfort that is severe or gets worse throughout the day.    Colon polyp, diverticulosis and hemorrhoid information provided  Begin Benefiber 1-2 tablespoons twice daily  Further recommendations to follow pending review of pathology report  Let me know if you have any further rectal bleeding  Hemorrhoids Hemorrhoids are swollen veins around the rectum or anus. There are two types of hemorrhoids:   Internal hemorrhoids. These occur in the veins just inside the rectum. They may poke through to the outside and become irritated and painful.  External hemorrhoids. These occur in the veins outside the anus and can be felt as a painful swelling or hard lump near the anus. CAUSES  Pregnancy.   Obesity.   Constipation or diarrhea.   Straining to have a bowel movement.   Sitting for long periods on the toilet.  Heavy lifting or other activity that caused you to strain.  Anal intercourse. SYMPTOMS   Pain.  Anal itching or irritation.   Rectal bleeding.   Fecal leakage.   Anal swelling.   One or more lumps around the anus.  DIAGNOSIS  Your caregiver may be able to diagnose hemorrhoids by visual examination. Other examinations or tests that may be performed include:   Examination of the rectal area with a gloved hand (digital rectal exam).   Examination of anal canal using a small tube (scope).   A blood test if you have lost a significant amount of blood.  A test to look inside the colon (sigmoidoscopy or  colonoscopy). TREATMENT Most hemorrhoids can be treated at home. However, if symptoms do not seem to be getting better or if you have a lot of rectal bleeding, your caregiver may perform a procedure to help make the hemorrhoids get smaller or remove them completely. Possible treatments include:   Placing a rubber band at the base of the hemorrhoid to cut off the circulation (rubber band ligation).   Injecting a chemical to shrink the hemorrhoid (sclerotherapy).   Using a tool to burn the hemorrhoid (infrared light therapy).   Surgically removing the hemorrhoid (hemorrhoidectomy).   Stapling the hemorrhoid to block blood flow to the tissue (hemorrhoid stapling).  HOME CARE INSTRUCTIONS   Eat foods with fiber, such as whole grains, beans, nuts, fruits, and vegetables. Ask your doctor about taking products with added fiber in them (fibersupplements).  Increase fluid intake. Drink enough water and fluids to keep your urine clear or pale yellow.   Exercise regularly.   Go to the bathroom when you have the urge to have a bowel movement. Do not wait.   Avoid straining to have bowel movements.   Keep the anal area dry and clean. Use wet toilet paper or moist towelettes after a bowel movement.   Medicated creams and suppositories may be used or applied as directed.   Only take over-the-counter or prescription medicines as directed by your caregiver.   Take warm sitz baths for 15-20 minutes, 3-4 times a day to ease pain and discomfort.   Place ice packs on the hemorrhoids if they are tender and swollen. Using ice packs between sitz baths may be helpful.   Put ice in a plastic bag.   Place a towel between your skin and the bag.   Leave the ice on for 15-20 minutes, 3-4 times a day.   Do not use a donut-shaped pillow or sit on the toilet for long periods. This increases blood pooling and pain.  SEEK MEDICAL CARE IF:  You have increasing pain and swelling that is not  controlled by treatment or medicine.  You have uncontrolled bleeding.  You have difficulty or you are unable to have a bowel movement.  You have pain or inflammation outside the area of the hemorrhoids. MAKE SURE YOU:  Understand these instructions.  Will watch your condition.  Will get help right away if you are not doing well or get worse.   This information is not intended to replace advice given to you by your health care provider. Make sure you discuss any questions you have with your health care provider.   Document Released: 12/02/2000 Document Revised: 11/21/2012 Document Reviewed: 10/09/2012 Elsevier Interactive Patient Education 2016 [employer].    Diverticulosis Diverticulosis is the condition that develops when small pouches (diverticula) form in the wall of your colon. Your colon, or large intestine, is where water is absorbed and stool is formed. The pouches form when the inside layer of your  colon pushes through weak spots in the outer layers of your colon. CAUSES  No one knows exactly what causes diverticulosis. RISK FACTORS  Being older than 73. Your risk for this condition increases with age. Diverticulosis is rare in people younger than 40 years. By age 64, almost everyone has it.  Eating a low-fiber diet.  Being frequently constipated.  Being overweight.  Not getting enough exercise.  Smoking.  Taking over-the-counter pain medicines, like aspirin and ibuprofen. SYMPTOMS  Most people with diverticulosis do not have symptoms. DIAGNOSIS  Because diverticulosis often has no symptoms, health care providers often discover the condition during an exam for other colon problems. In many cases, a health care provider will diagnose diverticulosis while using a flexible scope to examine the colon (colonoscopy). TREATMENT  If you have never developed an infection related to diverticulosis, you may not need treatment. If you have had an infection before,  treatment may include:  Eating more fruits, vegetables, and grains.  Taking a fiber supplement.  Taking a live bacteria supplement (probiotic).  Taking medicine to relax your colon. HOME CARE INSTRUCTIONS   Drink at least 6-8 glasses of water each day to prevent constipation.  Try not to strain when you have a bowel movement.  Keep all follow-up appointments. If you have had an infection before:  Increase the fiber in your diet as directed by your health care provider or dietitian.  Take a dietary fiber supplement if your health care provider approves.  Only take medicines as directed by your health care provider. SEEK MEDICAL CARE IF:   You have abdominal pain.  You have bloating.  You have cramps.  You have not gone to the bathroom in 3 days. SEEK IMMEDIATE MEDICAL CARE IF:   Your pain gets worse.  Yourbloating becomes very bad.  You have a fever or chills, and your symptoms suddenly get worse.  You begin vomiting.  You have bowel movements that are bloody or black. MAKE SURE YOU:  Understand these instructions.  Will watch your condition.  Will get help right away if you are not doing well or get worse.   This information is not intended to replace advice given to you by your health care provider. Make sure you discuss any questions you have with your health care provider.   Document Released: 09/01/2004 Document Revised: 12/10/2013 Document Reviewed: 10/30/2013 Elsevier Interactive Patient Education 2016 Elsevier Inc[employer]  Colon Polyps Polyps are lumps of extra tissue growing inside the body. Polyps can grow in the large intestine (colon). Most colon polyps are noncancerous (benign). However, some colon polyps can become cancerous over time. Polyps that are larger than a pea may be harmful. To be safe, caregivers remove and test all polyps. CAUSES  Polyps form when mutations in the genes cause your cells to grow and divide even though no more tissue is  needed. RISK FACTORS There are a number of risk factors that can increase your chances of getting colon polyps. They include:  Being older than 50 years.  Family history of colon polyps or colon cancer.  Long-term colon diseases, such as colitis or Crohn disease.  Being overweight.  Smoking.  Being inactive.  Drinking too much alcohol. SYMPTOMS  Most small polyps do not cause symptoms. If symptoms are present, they may include:  Blood in the stool. The stool may look dark red or black.  Constipation or diarrhea that lasts longer than 1 week. DIAGNOSIS People often do not know they have polyps until their  caregiver finds them during a regular checkup. Your caregiver can use 4 tests to check for polyps:  Digital rectal exam. The caregiver wears gloves and feels inside the rectum. This test would find polyps only in the rectum.  Barium enema. The caregiver puts a liquid called barium into your rectum before taking X-rays of your colon. Barium makes your colon look white. Polyps are dark, so they are easy to see in the X-ray pictures.  Sigmoidoscopy. A thin, flexible tube (sigmoidoscope) is placed into your rectum. The sigmoidoscope has a light and tiny camera in it. The caregiver uses the sigmoidoscope to look at the last third of your colon.  Colonoscopy. This test is like sigmoidoscopy, but the caregiver looks at the entire colon. This is the most common method for finding and removing polyps. TREATMENT  Any polyps will be removed during a sigmoidoscopy or colonoscopy. The polyps are then tested for cancer. PREVENTION  To help lower your risk of getting more colon polyps:  Eat plenty of fruits and vegetables. Avoid eating fatty foods.  Do not smoke.  Avoid drinking alcohol.  Exercise every day.  Lose weight if recommended by your caregiver.  Eat plenty of calcium and folate. Foods that are rich in calcium include milk, cheese, and broccoli. Foods that are rich in folate  include chickpeas, kidney beans, and spinach. HOME CARE INSTRUCTIONS Keep all follow-up appointments as directed by your caregiver. You may need periodic exams to check for polyps. SEEK MEDICAL CARE IF: You notice bleeding during a bowel movement.   This information is not intended to replace advice given to you by your health care provider. Make sure you discuss any questions you have with your health care provider.   Document Released: 08/31/2004 Document Revised: 12/26/2014 Document Reviewed: 02/14/2012 Elsevier Interactive Patient Education [employer].

## 2016-02-10 NOTE — H&P (View-Only) (Signed)
Primary Care Physician:  Purvis Kilts, MD Primary Gastroenterologist:  Dr. Gala Romney  Chief Complaint  Patient presents with  . Rectal Bleeding  . set up TCS    HPI:   45 year old male presents on referral from primary care for rectal bleeding. PCP notes reviewed. Last saw primary care on February 1 for rectal bleeding which is noted as bright red blood of a minimal amount and spotting on toilet tissue, without melena. Also complained of anal fullness and perianal pain. No record of colonoscopy and her system.  Today he states he first noticed rectal bleeding about 5-6 weeks ago, has tried rectal suppositories/creams with helped minimally. Worse after a bowel movement. Blood is bright red, on the stool and toilet tissue. Denies abdominal pain. Admits rectal pain/irritation intermittently but worse after a bowel movement. Denies fever, chills, unintentional weight loss, change in bowel habits. Has a bowel movement every day. Typically soft and passes without significant straining. Denies chest pain, dyspnea, dizziness, lightheadedness, syncope, near syncope. Denies any other upper or lower GI symptoms.  Past Medical History  Diagnosis Date  . Hypercholesteremia     Past Surgical History  Procedure Laterality Date  . Vasectomy    . Inguinal hernia repair Right 08/16/2013    Procedure: HERNIA REPAIR INGUINAL ADULT;  Surgeon: Donato Heinz, MD;  Location: [facility];  Service: General;  Laterality: Right;  . Septoplasty N/A 11/17/2015    Procedure: SEPTOPLASTY;  Surgeon: Leta Baptist, MD;  Location: [place];  Service: ENT;  Laterality: N/A;  . Sinus endo with fusion Left 11/17/2015    Procedure: LEFT ENDOSCOPIC TOTAL ETHMOIDECTOMY, LEFT ENDOSCOPIC MAXILLARY ANTROSTOMY, LEFT ENDOSCOPIC FRONTAL RECESS EXPLORATION WITH FUSION NAVIGATION;  Surgeon: Leta Baptist, MD;  Location: [place];  Service: ENT;  Laterality: Left;    Current Outpatient Prescriptions    Medication Sig Dispense Refill  . atorvastatin (LIPITOR) 20 MG tablet Take 20 mg by mouth daily.    Nicolette Bang 25 MG suppository Reported on 01/21/2016  3  . PROCTOZONE-HC 2.5 % rectal cream Reported on 01/21/2016  2   No current facility-administered medications for this visit.    Allergies as of 01/21/2016  . (No Known Allergies)    No family history on file.  Social History   Social History  . Marital Status: Divorced    Spouse Name: N/A  . Number of Children: N/A  . Years of Education: N/A   Occupational History  . Not on file.   Social History Main Topics  . Smoking status: Never Smoker   . Smokeless tobacco: Not on file  . Alcohol Use: No  . Drug Use: No  . Sexual Activity: Yes    Birth Control/ Protection: None   Other Topics Concern  . Not on file   Social History Narrative    Review of Systems: General: Negative for anorexia, weight loss, fever, chills, fatigue, weakness. Eyes: Negative for vision changes.  ENT: Negative for hoarseness, difficulty swallowing. CV: Negative for chest pain, angina, palpitations, peripheral edema.  Respiratory: Negative for dyspnea at rest, cough, sputum, wheezing.  GI: See history of present illness. Derm: Negative for rash or itching.  Endo: Negative for unusual weight change.  Heme: Negative for bruising or bleeding. Allergy: Negative for rash or hives.    Physical Exam: BP 136/94 mmHg  Pulse 66  Temp(Src) 97.1 F (36.2 C)  Ht 6' (1.829 m)  Wt 249 lb 6.4 oz (113.127 kg)  BMI 33.82  kg/m2 General:   Alert and oriented. Pleasant and cooperative. Well-nourished and well-developed.  Head:  Normocephalic and atraumatic. Eyes:  Without icterus, sclera clear and conjunctiva pink.  Ears:  Normal auditory acuity. Cardiovascular:  S1, S2 present without murmurs appreciated. Extremities without clubbing or edema. Respiratory:  Clear to auscultation bilaterally. No wheezes, rales, or rhonchi. No distress.   Gastrointestinal:  +BS, rounded but soft, non-tender and non-distended. No HSM noted. No guarding or rebound. No masses appreciated.  Rectal:  Deferred  Musculoskalatal:  Symmetrical without gross deformities. Skin:  Intact without significant lesions or rashes. Neurologic:  Alert and oriented x4;  grossly normal neurologically. Psych:  Alert and cooperative. Normal mood and affect. Heme/Lymph/Immune: No excessive bruising noted.    01/21/2016 8:38 AM

## 2016-02-10 NOTE — Op Note (Addendum)
[facility] [address] [place], [address]   COLONOSCOPY PROCEDURE REPORT  PATIENT: Jeffery Orozco, Jeffery Orozco  MR#: [id number] BIRTHDATE: Jul 12, 1971 , 44  yrs. old GENDER: male ENDOSCOPIST: R.  Garfield Cornea, MD FACP [facility] REFERRED [id number] Hilma Favors, M.D. PROCEDURE DATE:  02/10/2016 PROCEDURE:   Colonoscopy with biopsy INDICATIONS:paper hematochezia?"self-limiting in the setting of transient constipation related to narcotic use. MEDICATIONS: Versed 5 mg IV and Demerol 100 mg IV in divided doses. Zofran 4 mg IV. ASA CLASS:       Class II  CONSENT: The risks, benefits, alternatives and imponderables including but not limited to bleeding, perforation as well as the possibility of a missed lesion have been reviewed.  The potential for biopsy, lesion removal, etc. have also been discussed. Questions have been answered.  All parties agreeable.  Please see the history and physical in the medical record for more information.  DESCRIPTION OF PROCEDURE:   After the risks benefits and alternatives of the procedure were thoroughly explained, informed consent was obtained.  The digital rectal exam revealed no abnormalities of the rectum.   The [id number]  endoscope was introduced through the anus and advanced to the terminal ileum which was intubated for a short distance. No adverse events experienced.   The quality of the prep was adequate  The instrument was then slowly withdrawn as the colon was fully examined. Estimated blood loss is zero unless otherwise noted in this procedure report.      COLON FINDINGS: Minimal anal canal hemorrhoids; (1) diminutive polyp in the rectal mucosa 3 cm anal verge; otherwise, the remainder of the rectal mucosa appeared normal.  (2) diminutive polyps at splenic flexure and (1) diminutive polyp in the cecum; otherwise, the remainder of the colonic mucosa appeared normal.  The terminal ileum was about 5 cm. This segment of the GI  tract appear normal.  The above-mentioned polyps were all cold biopsied removed. Retroflexion was performed. .  Withdrawal time=  .  The scope was withdrawn and the procedure completed. COMPLICATIONS: There were no immediate complications.  ENDOSCOPIC IMPRESSION: Minimal hemorrhoids?"likely source of hematochezia. Diminutive colorectal polyps?"removed as described above  RECOMMENDATIONS: Follow-up of pathology.  eSigned:  R. Garfield Cornea, MD Rosalita Chessman [facility] 03-09-16 8:41 AM Revised: 2016/03/09 8:41 AM  cc:  CPT CODES: ICD CODES:  The ICD and CPT codes recommended by this software are interpretations from the data that the clinical staff has captured with the software.  The verification of the translation of this report to the ICD and CPT codes and modifiers is the sole responsibility of the health care institution and practicing physician where this report was generated.  [employer] will not be held responsible for the validity of the ICD and CPT codes included on this report.  AMA assumes no liability for data contained or not contained herein. CPT is a Designer, television/film set of the [employer].

## 2016-02-11 ENCOUNTER — Ambulatory Visit (INDEPENDENT_AMBULATORY_CARE_PROVIDER_SITE_OTHER): Payer: BLUE CROSS/BLUE SHIELD | Admitting: Otolaryngology

## 2016-02-12 ENCOUNTER — Encounter: Payer: Self-pay | Admitting: Internal Medicine

## 2016-02-12 ENCOUNTER — Encounter (HOSPITAL_COMMUNITY): Payer: Self-pay | Admitting: Internal Medicine

## 2016-03-22 DIAGNOSIS — R221 Localized swelling, mass and lump, neck: Secondary | ICD-10-CM | POA: Diagnosis not present

## 2016-03-22 DIAGNOSIS — E6609 Other obesity due to excess calories: Secondary | ICD-10-CM | POA: Diagnosis not present

## 2016-03-22 DIAGNOSIS — Z1389 Encounter for screening for other disorder: Secondary | ICD-10-CM | POA: Diagnosis not present

## 2016-03-22 DIAGNOSIS — R599 Enlarged lymph nodes, unspecified: Secondary | ICD-10-CM | POA: Diagnosis not present

## 2016-03-22 DIAGNOSIS — Z6833 Body mass index (BMI) 33.0-33.9, adult: Secondary | ICD-10-CM | POA: Diagnosis not present

## 2016-05-09 DIAGNOSIS — J029 Acute pharyngitis, unspecified: Secondary | ICD-10-CM | POA: Diagnosis not present

## 2016-05-09 DIAGNOSIS — Z6834 Body mass index (BMI) 34.0-34.9, adult: Secondary | ICD-10-CM | POA: Diagnosis not present

## 2016-05-09 DIAGNOSIS — Z1389 Encounter for screening for other disorder: Secondary | ICD-10-CM | POA: Diagnosis not present

## 2016-05-09 DIAGNOSIS — J069 Acute upper respiratory infection, unspecified: Secondary | ICD-10-CM | POA: Diagnosis not present

## 2016-06-19 IMAGING — CT CT MAXILLOFACIAL W/O CM
3 of 4 series · 14 of 47 positions shown, 16 images · non-contrast
Comparison: None.

CLINICAL DATA: Chronic left sinusitis.

EXAM:
CT MAXILLOFACIAL WITHOUT CONTRAST
TECHNIQUE: Multidetector CT imaging of the maxillofacial structures was
performed. Multiplanar CT image reconstructions were also generated.
A small metallic BB was placed on the right temple in order to
reliably differentiate right from left.

[Series 3: stealth sinus sinus alg 1.0 · axial · 0.35mm/px · z∈[+104,+214]mm · 8 of 130 slices shown, 10 images]
[im 10/130  brain]
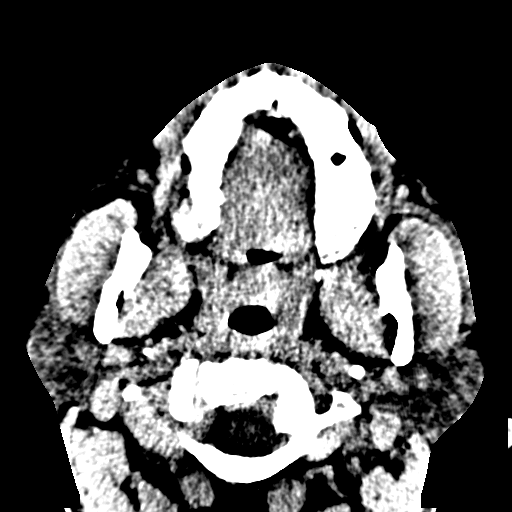
[im 10/130  bone]
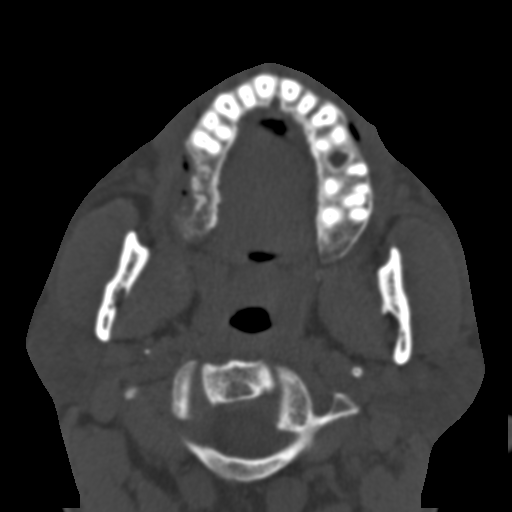
[im 28/130  bone]
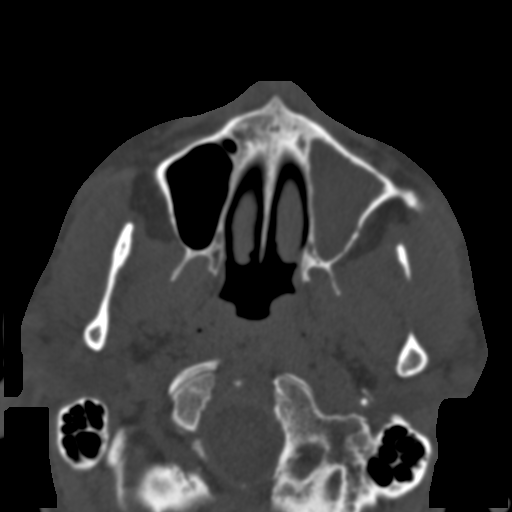
[im 47/130  bone]
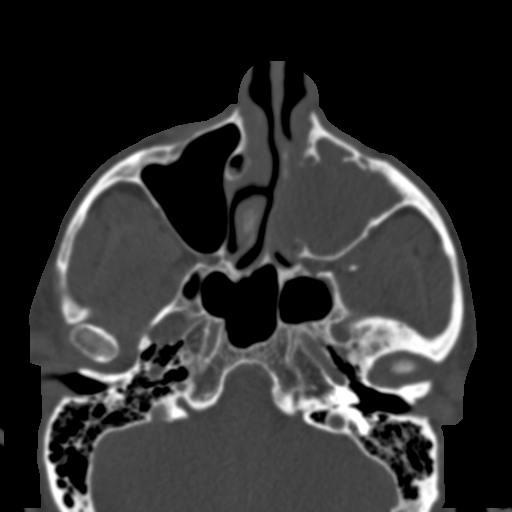
[im 56/130  bone]
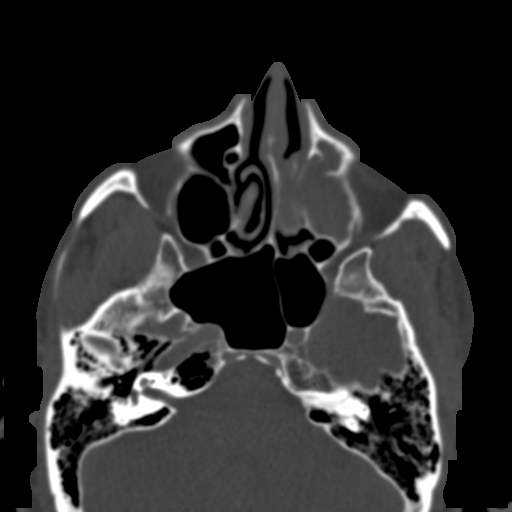
[im 74/130  brain]
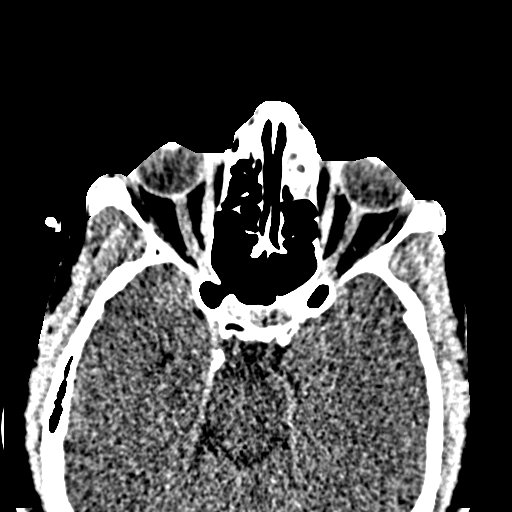
[im 74/130  bone]
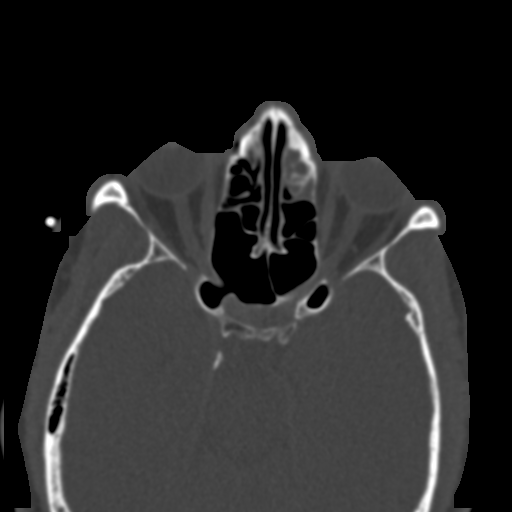
[im 83/130  bone]
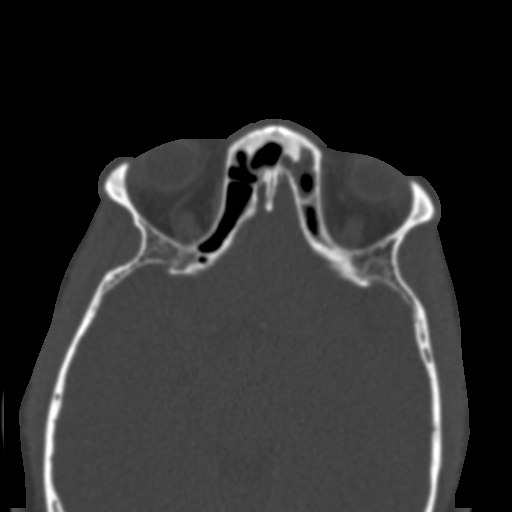
[im 102/130  bone]
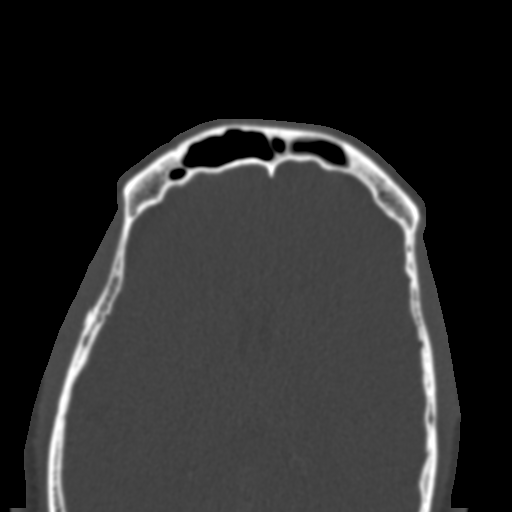
[im 120/130  bone]
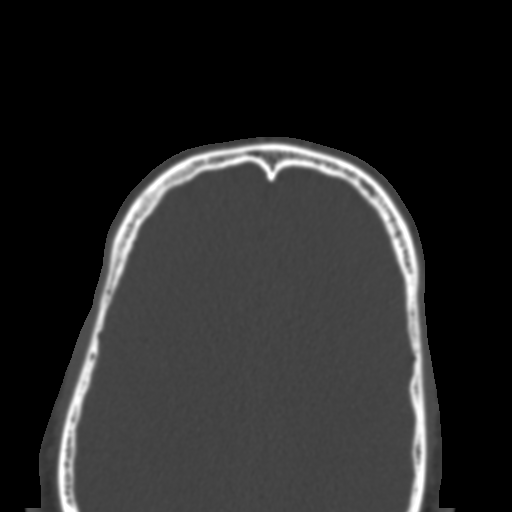

[Series 4: stealth sinus coro 2.0 · coronal · 0.25mm/px · 3 of 89 slices shown]
[im 30/89  bone]
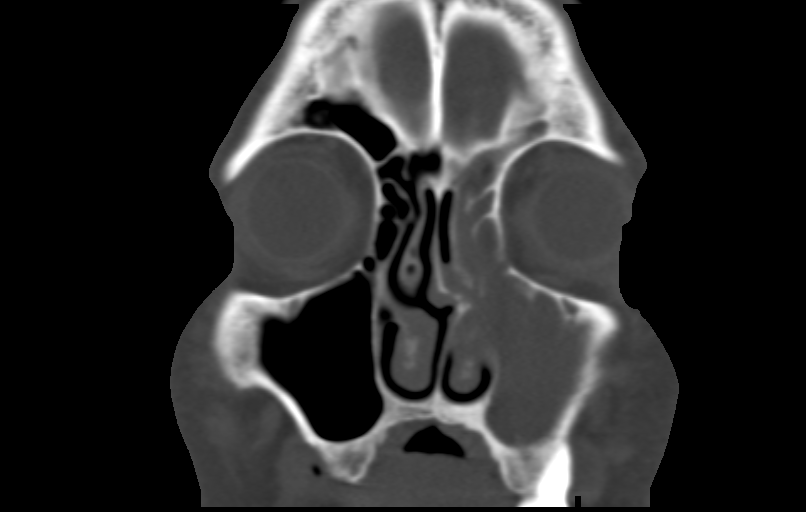
[im 40/89  bone]
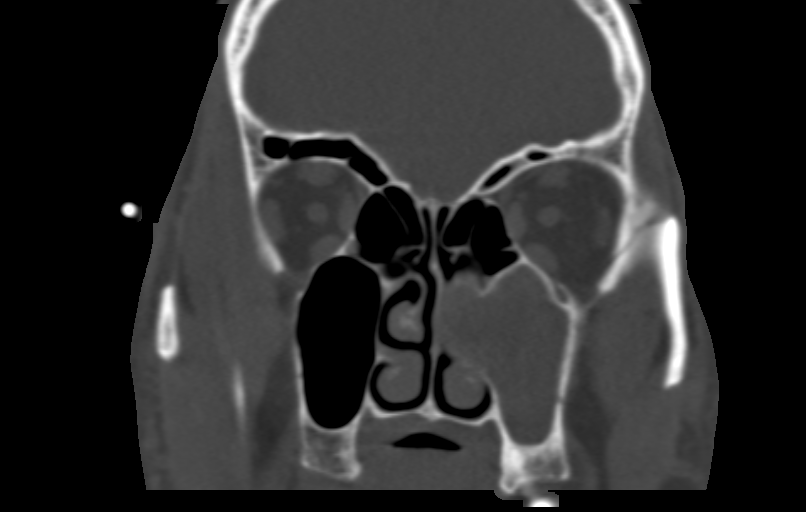
[im 49/89  bone]
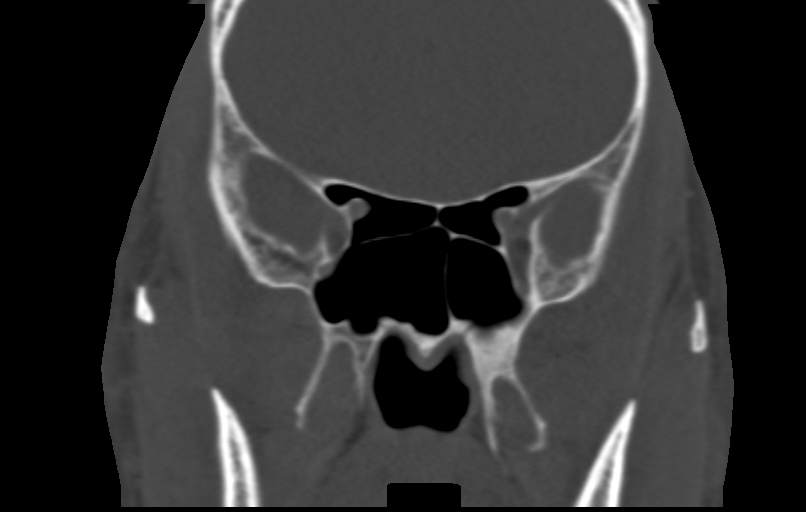

[Series 5: stealth sinus sag 2.0 · sagittal · 0.26mm/px · 3 of 98 slices shown]
[im 33/98  bone]
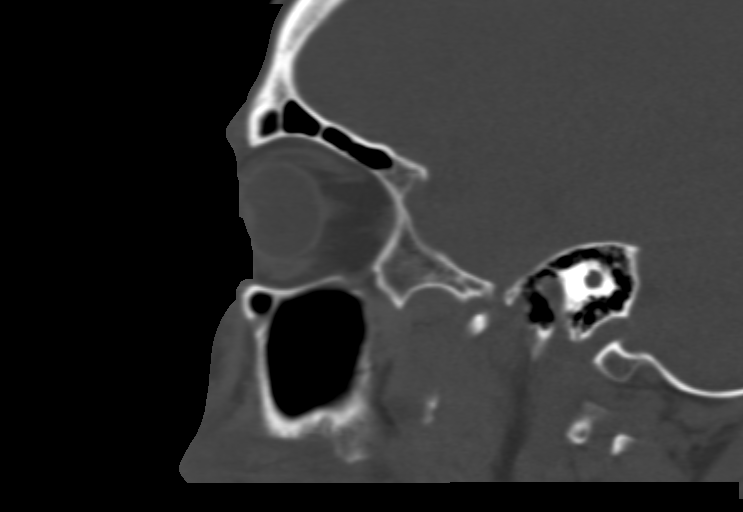
[im 49/98  bone]
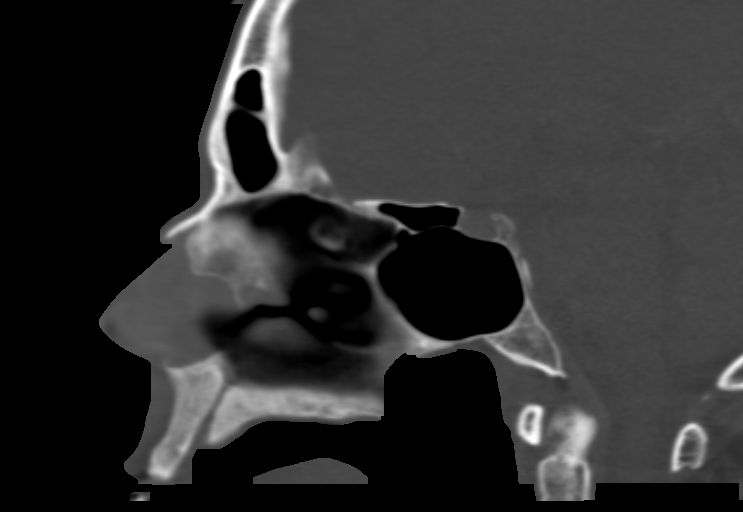
[im 65/98  bone]
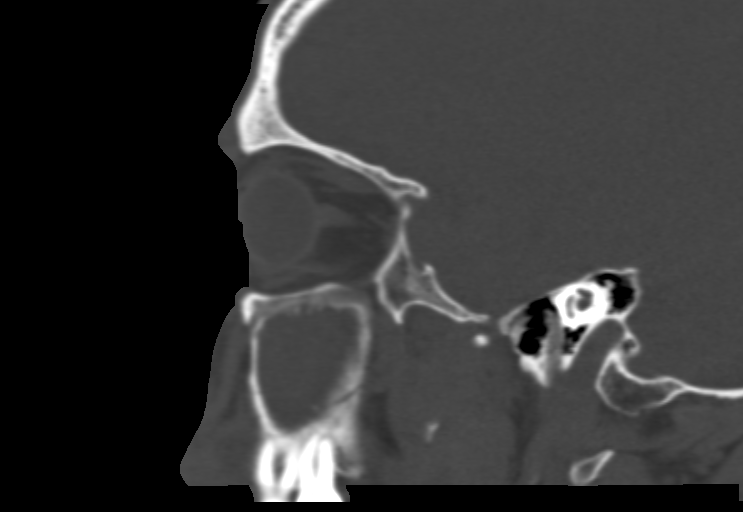

[14 of 47 positions shown; findings below may reference images not displayed]

FINDINGS: The left maxillary sinus is opacified. There is some wall
thickening. There is low-density material with expansion into the
left nasal cavity.

The anterior left ethmoid air cells are opacified. There is
circumferential mucosal thickening in the left frontal sinus. The
right paranasal sinuses and bilateral sphenoid sinuses are clear.
The mastoid air cells are clear.

Leftward nasal septal spurring measures 9 mm.

Limited imaging of the brain is unremarkable. The globes and orbits
are within normal limits.
IMPRESSION: 1. Soft tissue opacification of the left maxillary sinus with
expansion into the left nasal cavity. This raises concern for a
papilloma or other obstructing nasal mass. Obstruction by the nasal
spurring could cause chronic left nasal opacification is well.
2. Anterior left ethmoid and inferior left frontal sinus disease is
also present.
3.

## 2016-07-26 DIAGNOSIS — E669 Obesity, unspecified: Secondary | ICD-10-CM | POA: Diagnosis not present

## 2016-07-26 DIAGNOSIS — E785 Hyperlipidemia, unspecified: Secondary | ICD-10-CM | POA: Diagnosis not present

## 2016-07-26 DIAGNOSIS — Z1389 Encounter for screening for other disorder: Secondary | ICD-10-CM | POA: Diagnosis not present

## 2016-07-26 DIAGNOSIS — Z008 Encounter for other general examination: Secondary | ICD-10-CM | POA: Diagnosis not present

## 2016-09-05 ENCOUNTER — Ambulatory Visit (INDEPENDENT_AMBULATORY_CARE_PROVIDER_SITE_OTHER): Payer: BLUE CROSS/BLUE SHIELD | Admitting: Otolaryngology

## 2016-09-19 ENCOUNTER — Ambulatory Visit (INDEPENDENT_AMBULATORY_CARE_PROVIDER_SITE_OTHER): Payer: BLUE CROSS/BLUE SHIELD | Admitting: Otolaryngology

## 2016-09-19 DIAGNOSIS — J310 Chronic rhinitis: Secondary | ICD-10-CM

## 2016-11-25 DIAGNOSIS — Z1389 Encounter for screening for other disorder: Secondary | ICD-10-CM | POA: Diagnosis not present

## 2016-11-25 DIAGNOSIS — J069 Acute upper respiratory infection, unspecified: Secondary | ICD-10-CM | POA: Diagnosis not present

## 2016-11-25 DIAGNOSIS — J343 Hypertrophy of nasal turbinates: Secondary | ICD-10-CM | POA: Diagnosis not present

## 2016-11-25 DIAGNOSIS — J302 Other seasonal allergic rhinitis: Secondary | ICD-10-CM | POA: Diagnosis not present

## 2016-11-25 DIAGNOSIS — R070 Pain in throat: Secondary | ICD-10-CM | POA: Diagnosis not present

## 2016-11-25 DIAGNOSIS — Z6835 Body mass index (BMI) 35.0-35.9, adult: Secondary | ICD-10-CM | POA: Diagnosis not present

## 2017-01-13 DIAGNOSIS — R05 Cough: Secondary | ICD-10-CM | POA: Diagnosis not present

## 2017-01-13 DIAGNOSIS — Z6835 Body mass index (BMI) 35.0-35.9, adult: Secondary | ICD-10-CM | POA: Diagnosis not present

## 2017-01-13 DIAGNOSIS — E6609 Other obesity due to excess calories: Secondary | ICD-10-CM | POA: Diagnosis not present

## 2017-01-13 DIAGNOSIS — Z1389 Encounter for screening for other disorder: Secondary | ICD-10-CM | POA: Diagnosis not present

## 2017-02-14 ENCOUNTER — Other Ambulatory Visit (HOSPITAL_COMMUNITY): Payer: Self-pay | Admitting: Family Medicine

## 2017-02-14 ENCOUNTER — Ambulatory Visit (HOSPITAL_COMMUNITY)
Admission: RE | Admit: 2017-02-14 | Discharge: 2017-02-14 | Disposition: A | Discharge status: Home / Self Care | Payer: BLUE CROSS/BLUE SHIELD | Source: Ambulatory Visit | Attending: Family Medicine | Admitting: Family Medicine

## 2017-02-14 DIAGNOSIS — J302 Other seasonal allergic rhinitis: Secondary | ICD-10-CM | POA: Diagnosis not present

## 2017-02-14 DIAGNOSIS — Z6835 Body mass index (BMI) 35.0-35.9, adult: Secondary | ICD-10-CM | POA: Diagnosis not present

## 2017-02-14 DIAGNOSIS — R05 Cough: Secondary | ICD-10-CM | POA: Insufficient documentation

## 2017-02-14 DIAGNOSIS — E782 Mixed hyperlipidemia: Secondary | ICD-10-CM | POA: Diagnosis not present

## 2017-02-14 DIAGNOSIS — R079 Chest pain, unspecified: Secondary | ICD-10-CM | POA: Diagnosis not present

## 2017-02-14 DIAGNOSIS — Z0000 Encounter for general adult medical examination without abnormal findings: Secondary | ICD-10-CM | POA: Diagnosis not present

## 2017-02-14 DIAGNOSIS — Z1389 Encounter for screening for other disorder: Secondary | ICD-10-CM | POA: Diagnosis not present

## 2017-02-14 DIAGNOSIS — R059 Cough, unspecified: Secondary | ICD-10-CM

## 2017-05-09 DIAGNOSIS — E669 Obesity, unspecified: Secondary | ICD-10-CM | POA: Diagnosis not present

## 2017-05-09 DIAGNOSIS — Z6836 Body mass index (BMI) 36.0-36.9, adult: Secondary | ICD-10-CM | POA: Diagnosis not present

## 2017-05-09 DIAGNOSIS — Z008 Encounter for other general examination: Secondary | ICD-10-CM | POA: Diagnosis not present

## 2017-05-09 DIAGNOSIS — Z719 Counseling, unspecified: Secondary | ICD-10-CM | POA: Diagnosis not present

## 2017-05-09 DIAGNOSIS — E785 Hyperlipidemia, unspecified: Secondary | ICD-10-CM | POA: Diagnosis not present

## 2017-05-09 DIAGNOSIS — Z1389 Encounter for screening for other disorder: Secondary | ICD-10-CM | POA: Diagnosis not present

## 2017-08-22 DIAGNOSIS — E785 Hyperlipidemia, unspecified: Secondary | ICD-10-CM | POA: Diagnosis not present

## 2017-08-22 DIAGNOSIS — Z79899 Other long term (current) drug therapy: Secondary | ICD-10-CM | POA: Diagnosis not present

## 2017-08-22 DIAGNOSIS — Z0000 Encounter for general adult medical examination without abnormal findings: Secondary | ICD-10-CM | POA: Diagnosis not present

## 2017-08-22 DIAGNOSIS — Z139 Encounter for screening, unspecified: Secondary | ICD-10-CM | POA: Diagnosis not present

## 2017-09-05 DIAGNOSIS — Z6836 Body mass index (BMI) 36.0-36.9, adult: Secondary | ICD-10-CM | POA: Diagnosis not present

## 2017-09-05 DIAGNOSIS — E785 Hyperlipidemia, unspecified: Secondary | ICD-10-CM | POA: Diagnosis not present

## 2017-09-05 DIAGNOSIS — Z719 Counseling, unspecified: Secondary | ICD-10-CM | POA: Diagnosis not present

## 2017-09-05 DIAGNOSIS — Z008 Encounter for other general examination: Secondary | ICD-10-CM | POA: Diagnosis not present

## 2017-09-05 DIAGNOSIS — E669 Obesity, unspecified: Secondary | ICD-10-CM | POA: Diagnosis not present

## 2017-10-19 IMAGING — DX DG CHEST 2V
2 series · 2 of 2 positions shown · non-contrast
Comparison: 02/08/2005

CLINICAL DATA: Cough.  Chest pain when coughing.

EXAM:
CHEST  2 VIEW

[chest pa]
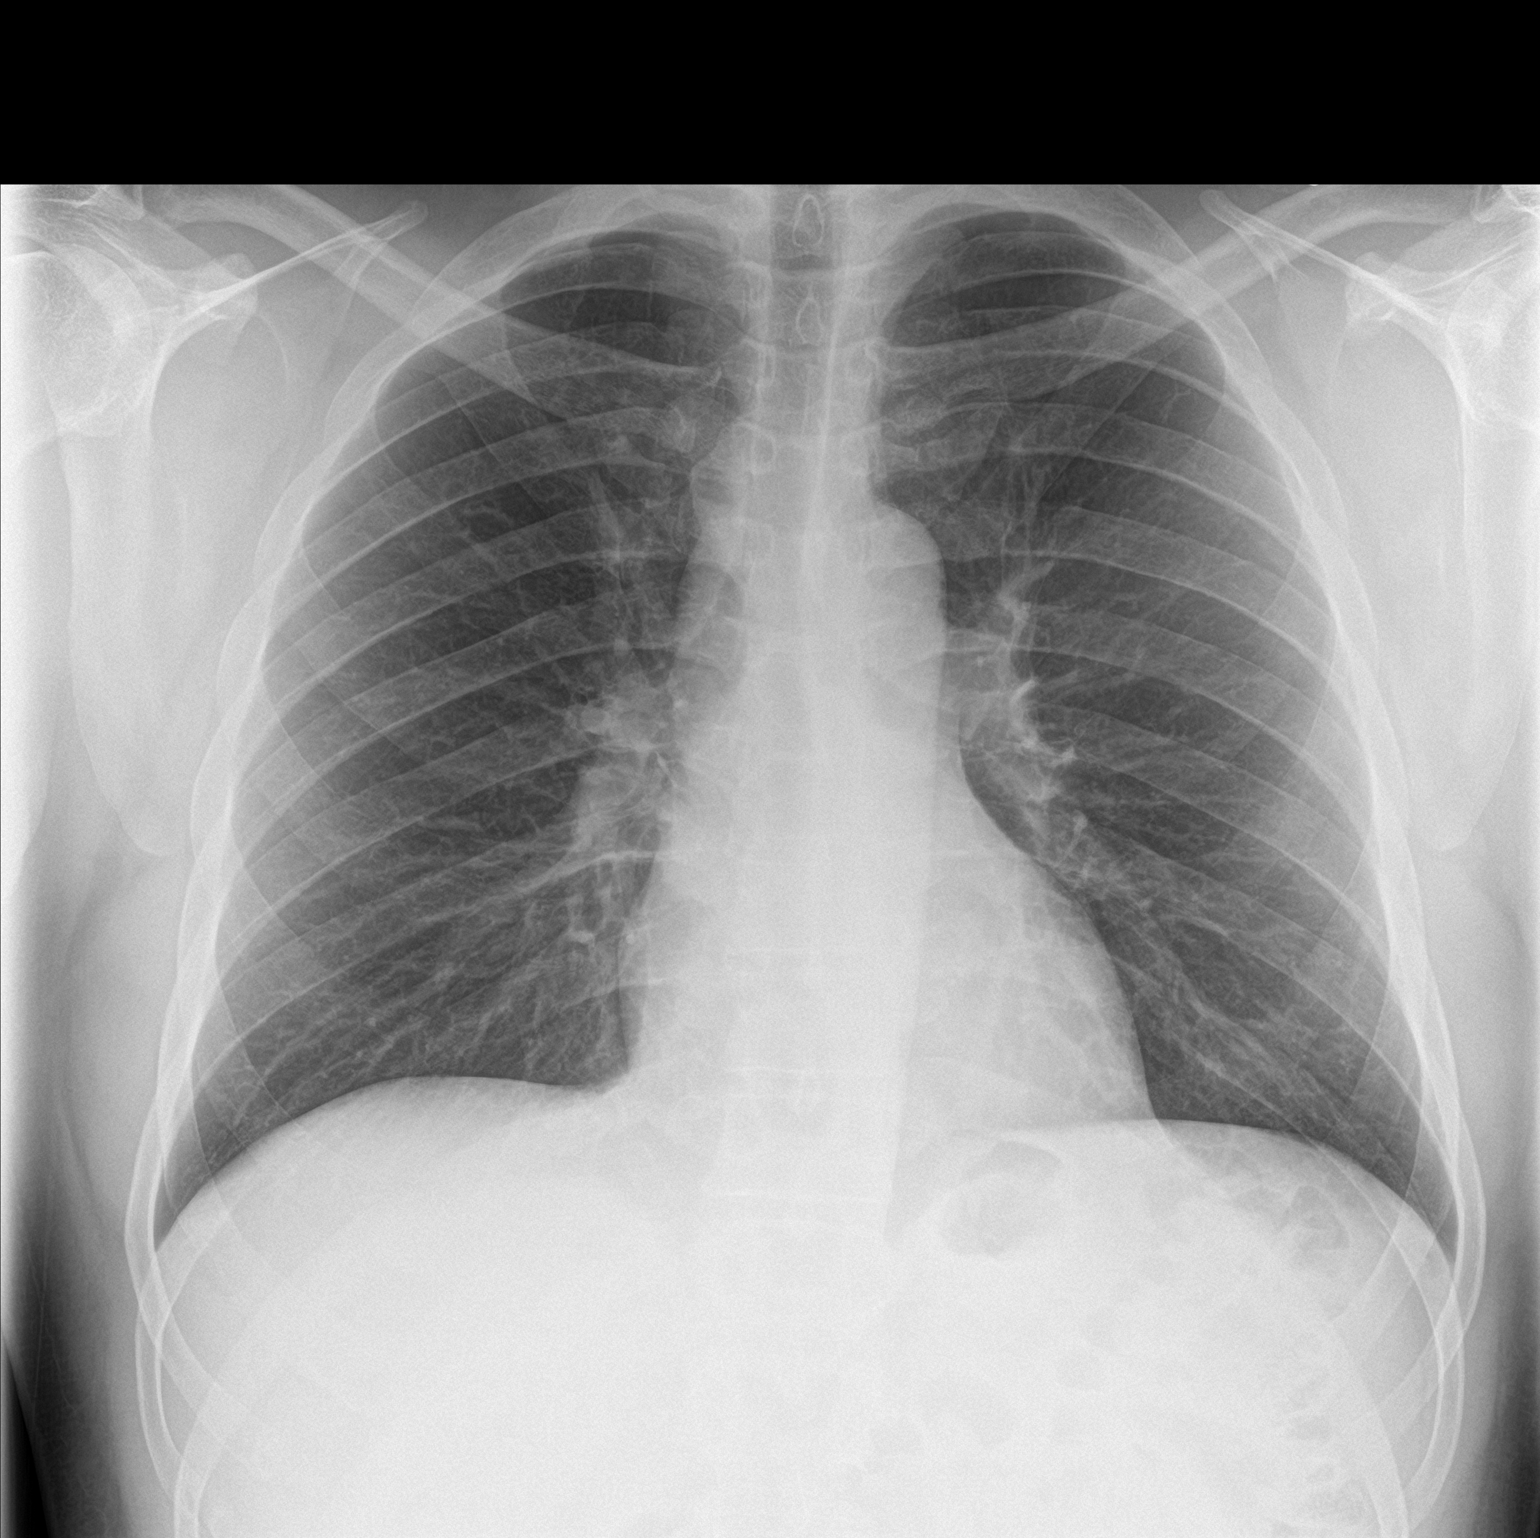

[chest lat]
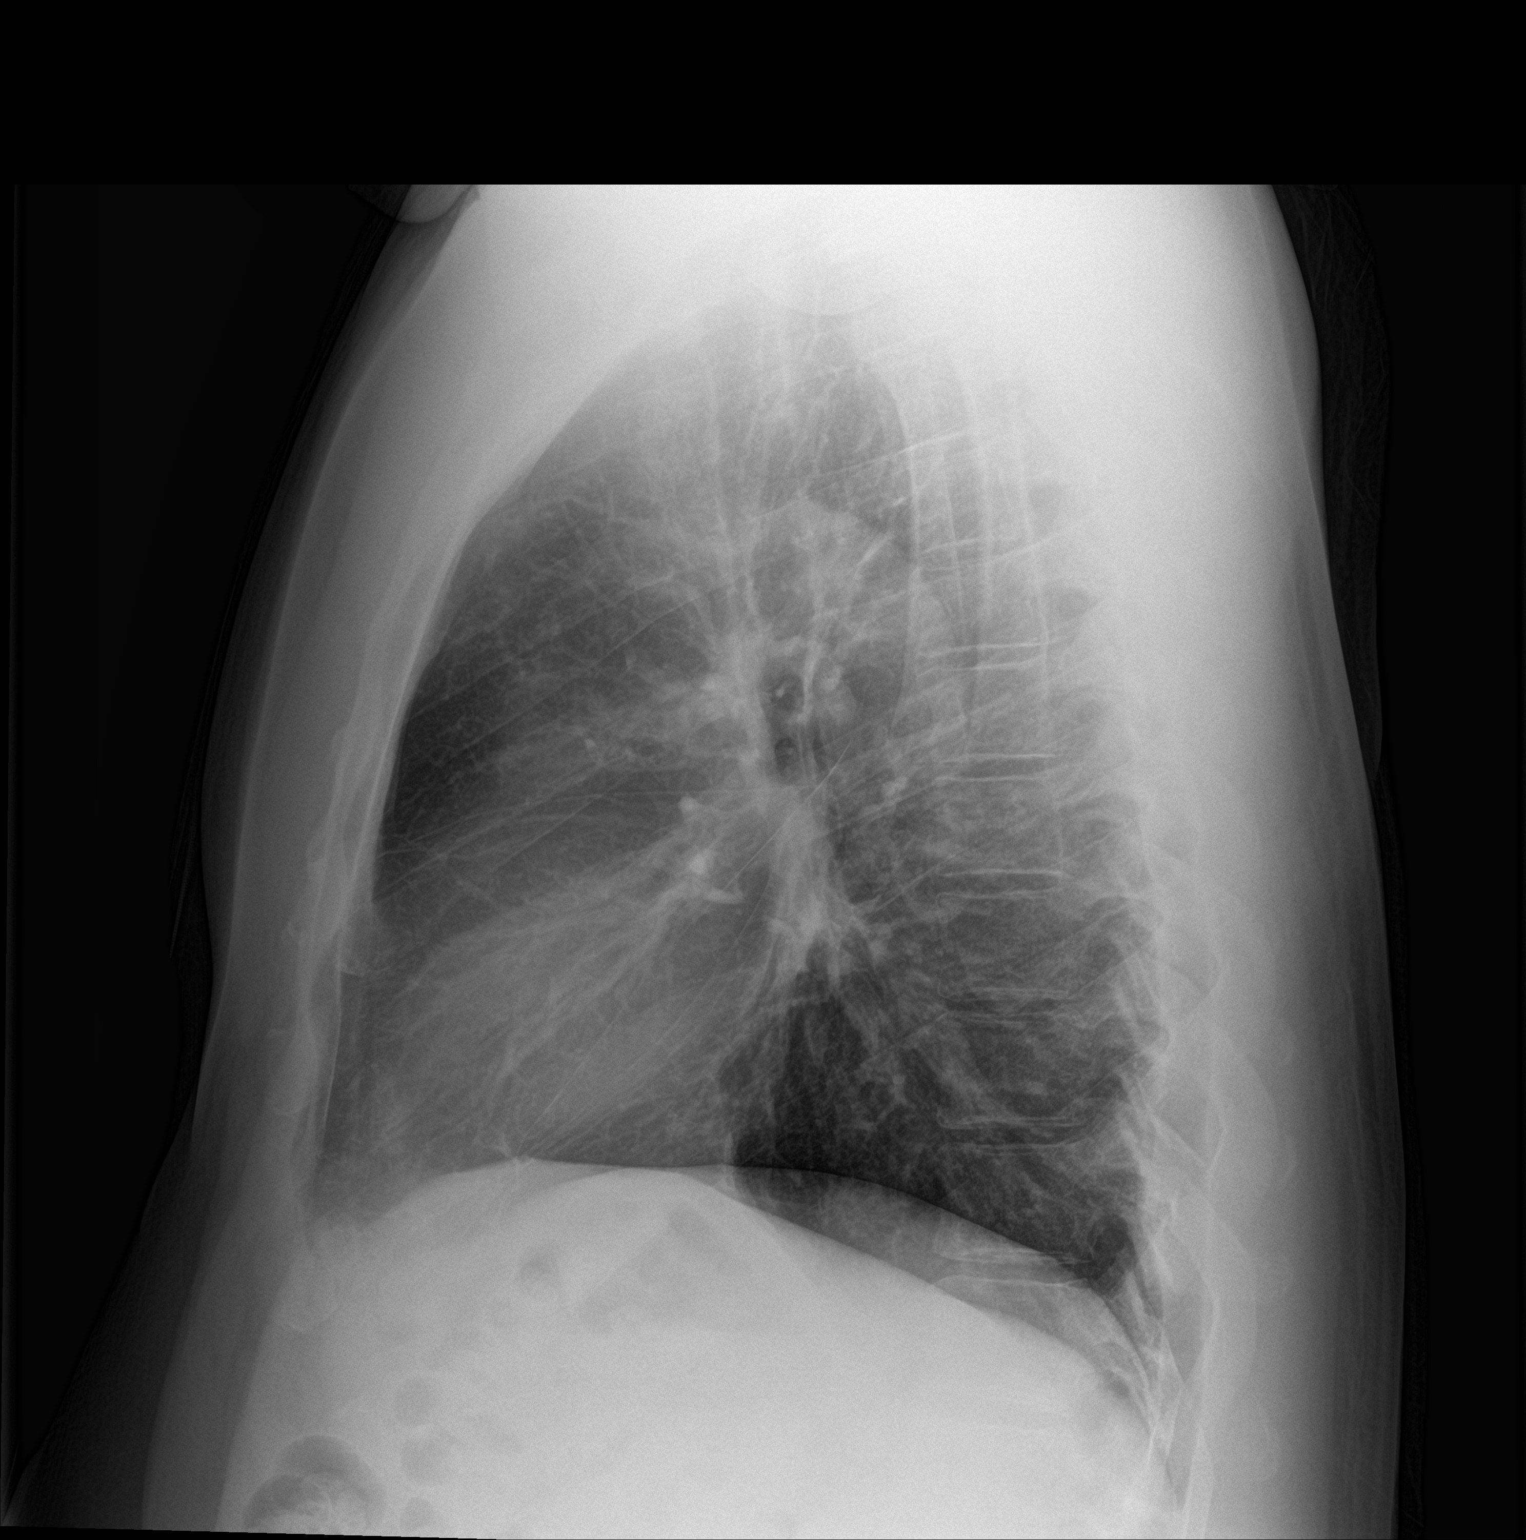

[2 of 2 positions shown; findings below may reference images not displayed]

FINDINGS: The heart size and mediastinal contours are within normal limits.
Both lungs are clear. The visualized skeletal structures are
unremarkable.
IMPRESSION: Normal chest.

## 2018-03-05 DIAGNOSIS — Z1389 Encounter for screening for other disorder: Secondary | ICD-10-CM | POA: Diagnosis not present

## 2018-03-05 DIAGNOSIS — N529 Male erectile dysfunction, unspecified: Secondary | ICD-10-CM | POA: Diagnosis not present

## 2018-03-05 DIAGNOSIS — Z0001 Encounter for general adult medical examination with abnormal findings: Secondary | ICD-10-CM | POA: Diagnosis not present

## 2018-03-05 DIAGNOSIS — Z6834 Body mass index (BMI) 34.0-34.9, adult: Secondary | ICD-10-CM | POA: Diagnosis not present

## 2018-03-05 DIAGNOSIS — E6609 Other obesity due to excess calories: Secondary | ICD-10-CM | POA: Diagnosis not present

## 2018-03-05 DIAGNOSIS — R001 Bradycardia, unspecified: Secondary | ICD-10-CM | POA: Diagnosis not present

## 2018-03-05 DIAGNOSIS — E782 Mixed hyperlipidemia: Secondary | ICD-10-CM | POA: Diagnosis not present

## 2018-05-29 DIAGNOSIS — Z719 Counseling, unspecified: Secondary | ICD-10-CM | POA: Diagnosis not present

## 2018-05-29 DIAGNOSIS — Z008 Encounter for other general examination: Secondary | ICD-10-CM | POA: Diagnosis not present

## 2018-05-29 DIAGNOSIS — E785 Hyperlipidemia, unspecified: Secondary | ICD-10-CM | POA: Diagnosis not present

## 2018-05-29 DIAGNOSIS — E669 Obesity, unspecified: Secondary | ICD-10-CM | POA: Diagnosis not present

## 2018-07-03 DIAGNOSIS — E669 Obesity, unspecified: Secondary | ICD-10-CM | POA: Diagnosis not present

## 2018-07-03 DIAGNOSIS — Z6836 Body mass index (BMI) 36.0-36.9, adult: Secondary | ICD-10-CM | POA: Diagnosis not present

## 2018-07-03 DIAGNOSIS — Z008 Encounter for other general examination: Secondary | ICD-10-CM | POA: Diagnosis not present

## 2018-07-03 DIAGNOSIS — E785 Hyperlipidemia, unspecified: Secondary | ICD-10-CM | POA: Diagnosis not present

## 2018-07-03 DIAGNOSIS — Z719 Counseling, unspecified: Secondary | ICD-10-CM | POA: Diagnosis not present

## 2019-03-11 DIAGNOSIS — Z1389 Encounter for screening for other disorder: Secondary | ICD-10-CM | POA: Diagnosis not present

## 2019-03-11 DIAGNOSIS — Z6835 Body mass index (BMI) 35.0-35.9, adult: Secondary | ICD-10-CM | POA: Diagnosis not present

## 2019-03-11 DIAGNOSIS — Z0001 Encounter for general adult medical examination with abnormal findings: Secondary | ICD-10-CM | POA: Diagnosis not present

## 2019-03-11 DIAGNOSIS — E7849 Other hyperlipidemia: Secondary | ICD-10-CM | POA: Diagnosis not present

## 2019-03-11 DIAGNOSIS — R001 Bradycardia, unspecified: Secondary | ICD-10-CM | POA: Diagnosis not present

## 2019-03-11 DIAGNOSIS — K635 Polyp of colon: Secondary | ICD-10-CM | POA: Diagnosis not present

## 2020-03-11 DIAGNOSIS — E6609 Other obesity due to excess calories: Secondary | ICD-10-CM | POA: Diagnosis not present

## 2020-03-11 DIAGNOSIS — N529 Male erectile dysfunction, unspecified: Secondary | ICD-10-CM | POA: Diagnosis not present

## 2020-03-11 DIAGNOSIS — E7849 Other hyperlipidemia: Secondary | ICD-10-CM | POA: Diagnosis not present

## 2020-03-11 DIAGNOSIS — Z1389 Encounter for screening for other disorder: Secondary | ICD-10-CM | POA: Diagnosis not present

## 2020-03-11 DIAGNOSIS — Z6835 Body mass index (BMI) 35.0-35.9, adult: Secondary | ICD-10-CM | POA: Diagnosis not present

## 2020-03-11 DIAGNOSIS — J302 Other seasonal allergic rhinitis: Secondary | ICD-10-CM | POA: Diagnosis not present

## 2020-03-11 DIAGNOSIS — Z0001 Encounter for general adult medical examination with abnormal findings: Secondary | ICD-10-CM | POA: Diagnosis not present

## 2021-03-15 DIAGNOSIS — Z1331 Encounter for screening for depression: Secondary | ICD-10-CM | POA: Diagnosis not present

## 2021-03-15 DIAGNOSIS — Z0001 Encounter for general adult medical examination with abnormal findings: Secondary | ICD-10-CM | POA: Diagnosis not present

## 2021-03-15 DIAGNOSIS — J302 Other seasonal allergic rhinitis: Secondary | ICD-10-CM | POA: Diagnosis not present

## 2021-03-15 DIAGNOSIS — Z1389 Encounter for screening for other disorder: Secondary | ICD-10-CM | POA: Diagnosis not present

## 2021-03-15 DIAGNOSIS — Z6834 Body mass index (BMI) 34.0-34.9, adult: Secondary | ICD-10-CM | POA: Diagnosis not present

## 2021-03-15 DIAGNOSIS — K635 Polyp of colon: Secondary | ICD-10-CM | POA: Diagnosis not present

## 2021-03-15 DIAGNOSIS — E7849 Other hyperlipidemia: Secondary | ICD-10-CM | POA: Diagnosis not present

## 2021-03-15 DIAGNOSIS — E669 Obesity, unspecified: Secondary | ICD-10-CM | POA: Diagnosis not present

## 2021-10-20 DIAGNOSIS — L729 Follicular cyst of the skin and subcutaneous tissue, unspecified: Secondary | ICD-10-CM | POA: Diagnosis not present

## 2021-10-20 DIAGNOSIS — Z6836 Body mass index (BMI) 36.0-36.9, adult: Secondary | ICD-10-CM | POA: Diagnosis not present

## 2021-11-03 ENCOUNTER — Encounter: Payer: Self-pay | Admitting: Family Medicine

## 2021-11-22 ENCOUNTER — Other Ambulatory Visit: Payer: Self-pay | Admitting: *Deleted

## 2021-11-22 DIAGNOSIS — L729 Follicular cyst of the skin and subcutaneous tissue, unspecified: Secondary | ICD-10-CM

## 2021-11-25 ENCOUNTER — Other Ambulatory Visit: Payer: Self-pay

## 2021-11-25 ENCOUNTER — Encounter: Payer: Self-pay | Admitting: General Surgery

## 2021-11-25 ENCOUNTER — Ambulatory Visit: Payer: BC Managed Care – PPO | Admitting: General Surgery

## 2021-11-25 VITALS — BP 115/80 | HR 60 | Temp 97.7°F | Resp 14 | Ht 72.0 in | Wt 269.0 lb

## 2021-11-25 DIAGNOSIS — L723 Sebaceous cyst: Secondary | ICD-10-CM

## 2021-11-26 NOTE — H&P (Signed)
Jeffery Orozco; [id number]; 1971-10-29   HPI Patient is a 50 year old black male who was referred to my care by Dr. Hilma Favors for evaluation and treatment of a sebaceous cyst on his scalp as well as on his left leg.  Both have been present for some time.  They are tender to touch when pressure is applied.  No active drainage is noted. Past Medical History:  Diagnosis Date   Hypercholesteremia     Past Surgical History:  Procedure Laterality Date   COLONOSCOPY N/A 02/10/2016   Procedure: COLONOSCOPY;  Surgeon: Daneil Dolin, MD;  [facility];  Service: Endoscopy;  Laterality: N/A;  100   INGUINAL HERNIA REPAIR Right 08/16/2013   Procedure: HERNIA REPAIR INGUINAL ADULT;  Surgeon: Donato Heinz, MD;  Location: [facility];  Service: General;  Laterality: Right;   SEPTOPLASTY N/A 11/17/2015   Procedure: SEPTOPLASTY;  Surgeon: Leta Baptist, MD;  Location: [place];  Service: ENT;  Laterality: N/A;   SINUS ENDO WITH FUSION Left 11/17/2015   Procedure: LEFT ENDOSCOPIC TOTAL ETHMOIDECTOMY, LEFT ENDOSCOPIC MAXILLARY ANTROSTOMY, LEFT ENDOSCOPIC FRONTAL RECESS EXPLORATION WITH FUSION NAVIGATION;  Surgeon: Leta Baptist, MD;  Location: [place];  Service: ENT;  Laterality: Left;   VASECTOMY      Family History  Problem Relation Age of Onset   Colon cancer Neg Hx     Current Outpatient Medications on File Prior to Visit  Medication Sig Dispense Refill   atorvastatin (LIPITOR) 20 MG tablet Take 20 mg by mouth daily.     No current facility-administered medications on file prior to visit.    No Known Allergies  Social History   Substance and Sexual Activity  Alcohol Use No   Alcohol/week: 0.0 standard drinks    Social History   Tobacco Use  Smoking Status Never  Smokeless Tobacco Never    Review of Systems  Constitutional: Negative.   HENT: Negative.    Eyes: Negative.   Respiratory:  Positive for cough.   Cardiovascular: Negative.    Gastrointestinal: Negative.   Genitourinary: Negative.   Musculoskeletal: Negative.   Skin: Negative.   Neurological: Negative.   Endo/Heme/Allergies: Negative.   Psychiatric/Behavioral: Negative.     Objective   Vitals:   11/25/21 0954  BP: 115/80  Pulse: 60  Resp: 14  Temp: 97.7 F (36.5 C)  SpO2: 98%    Physical Exam Vitals reviewed.  Constitutional:      Appearance: Normal appearance. He is not ill-appearing.  HENT:     Head: Normocephalic and atraumatic.     Comments: 1.5 cm hard sebaceous cyst noted on the cephalad area of the scalp.  Punctum present. Cardiovascular:     Rate and Rhythm: Normal rate. Rhythm irregular.     Heart sounds: Normal heart sounds. No murmur heard.   No friction rub. No gallop.  Pulmonary:     Effort: Pulmonary effort is normal. No respiratory distress.     Breath sounds: Normal breath sounds. No stridor. No wheezing, rhonchi or rales.  Musculoskeletal:     Comments:   2 cm sebaceous cyst noted left lower extremity on the posterior aspect of the calf.  Skin:    General: Skin is warm and dry.  Neurological:     Mental Status: He is alert and oriented to person, place, and time.   Primary care notes reviewed Assessment  Sebaceous cyst, scalp and left lower extremity Plan  Patient is scheduled for excision of the sebaceous cysts  on the scalp and left lower extremity.  The risks and benefits of the procedure including bleeding, infection, and recurrence of the cyst were fully explained to the patient, who gave informed consent.

## 2021-11-26 NOTE — Progress Notes (Signed)
Jeffery Orozco; [id number]; 10/28/1971   HPI Patient is a 50 year old black male who was referred to my care by Dr. Hilma Favors for evaluation and treatment of a sebaceous cyst on his scalp as well as on his left leg.  Both have been present for some time.  They are tender to touch when pressure is applied.  No active drainage is noted. Past Medical History:  Diagnosis Date   Hypercholesteremia     Past Surgical History:  Procedure Laterality Date   COLONOSCOPY N/A 02/10/2016   Procedure: COLONOSCOPY;  Surgeon: Daneil Dolin, MD;  [facility];  Service: Endoscopy;  Laterality: N/A;  100   INGUINAL HERNIA REPAIR Right 08/16/2013   Procedure: HERNIA REPAIR INGUINAL ADULT;  Surgeon: Donato Heinz, MD;  Location: [facility];  Service: General;  Laterality: Right;   SEPTOPLASTY N/A 11/17/2015   Procedure: SEPTOPLASTY;  Surgeon: Leta Baptist, MD;  Location: [place];  Service: ENT;  Laterality: N/A;   SINUS ENDO WITH FUSION Left 11/17/2015   Procedure: LEFT ENDOSCOPIC TOTAL ETHMOIDECTOMY, LEFT ENDOSCOPIC MAXILLARY ANTROSTOMY, LEFT ENDOSCOPIC FRONTAL RECESS EXPLORATION WITH FUSION NAVIGATION;  Surgeon: Leta Baptist, MD;  Location: [place];  Service: ENT;  Laterality: Left;   VASECTOMY      Family History  Problem Relation Age of Onset   Colon cancer Neg Hx     Current Outpatient Medications on File Prior to Visit  Medication Sig Dispense Refill   atorvastatin (LIPITOR) 20 MG tablet Take 20 mg by mouth daily.     No current facility-administered medications on file prior to visit.    No Known Allergies  Social History   Substance and Sexual Activity  Alcohol Use No   Alcohol/week: 0.0 standard drinks    Social History   Tobacco Use  Smoking Status Never  Smokeless Tobacco Never    Review of Systems  Constitutional: Negative.   HENT: Negative.    Eyes: Negative.   Respiratory:  Positive for cough.   Cardiovascular: Negative.    Gastrointestinal: Negative.   Genitourinary: Negative.   Musculoskeletal: Negative.   Skin: Negative.   Neurological: Negative.   Endo/Heme/Allergies: Negative.   Psychiatric/Behavioral: Negative.     Objective   Vitals:   11/25/21 0954  BP: 115/80  Pulse: 60  Resp: 14  Temp: 97.7 F (36.5 C)  SpO2: 98%    Physical Exam Vitals reviewed.  Constitutional:      Appearance: Normal appearance. He is not ill-appearing.  HENT:     Head: Normocephalic and atraumatic.     Comments: 1.5 cm hard sebaceous cyst noted on the cephalad area of the scalp.  Punctum present. Cardiovascular:     Rate and Rhythm: Normal rate. Rhythm irregular.     Heart sounds: Normal heart sounds. No murmur heard.   No friction rub. No gallop.  Pulmonary:     Effort: Pulmonary effort is normal. No respiratory distress.     Breath sounds: Normal breath sounds. No stridor. No wheezing, rhonchi or rales.  Musculoskeletal:     Comments:   2 cm sebaceous cyst noted left lower extremity on the posterior aspect of the calf.  Skin:    General: Skin is warm and dry.  Neurological:     Mental Status: He is alert and oriented to person, place, and time.   Primary care notes reviewed Assessment  Sebaceous cyst, scalp and left lower extremity Plan  Patient is scheduled for excision of the sebaceous cysts  on the scalp and left lower extremity.  The risks and benefits of the procedure including bleeding, infection, and recurrence of the cyst were fully explained to the patient, who gave informed consent.

## 2021-12-07 NOTE — Patient Instructions (Signed)
Jeffery Orozco  12/07/2021     @[email or web address] @   Your procedure is scheduled on  12/15/2021.   Report to [facility] at  0830 A.M.   Call this number if you have problems the morning of surgery:  [phone]   Remember:  Do not eat or drink after midnight.      Take these medicines the morning of surgery with A SIP OF WATER                                         None     Do not wear jewelry, make-up or nail polish.  Do not wear lotions, powders, or perfumes, or deodorant.  Do not shave 48 hours prior to surgery.  Men may shave face and neck.  Do not bring valuables to the hospital.  [facility] is not responsible for any belongings or valuables.  Contacts, dentures or bridgework may not be worn into surgery.  Leave your suitcase in the car.  After surgery it may be brought to your room.  For patients admitted to the hospital, discharge time will be determined by your treatment team.  Patients discharged the day of surgery will not be allowed to drive home and must have someone with them for 24 hours.    Special instructions:   DO NOT smoke tobacco or vape for 24 hours before your procedure.  Please read over the following fact sheets that you were given. Coughing and Deep Breathing, Surgical Site Infection Prevention, Anesthesia Post-op Instructions, and Care and Recovery After Surgery      Epidermoid Cyst Removal, Care After This sheet gives you information about how to care for yourself after your procedure. Your health care provider may also give you more specific instructions. If you have problems or questions, contact your health care provider. What can I expect after the procedure? After the procedure, it is common to have: Soreness in the area where your cyst was removed. Tightness or itchiness from the stitches (sutures) in your skin. Follow these instructions at home: Medicines Take over-the-counter and prescription medicines only as  told by your health care provider. If you were prescribed an antibiotic medicine or ointment, take or apply it as told by your health care provider. Do not stop using the antibiotic even if you start to feel better. Incision care  Follow instructions from your health care provider about how to take care of your incision. Make sure you: Wash your hands with soap and water for at least 20 seconds before you change your bandage (dressing). If soap and water are not available, use hand sanitizer. Change your dressing as told by your health care provider. Leave sutures, skin glue, or adhesive strips in place. These skin closures may need to stay in place for 1-2 weeks or longer. If adhesive strip edges start to loosen and curl up, you may trim the loose edges. Do not remove adhesive strips completely unless your health care provider tells you to do that. Keep the dressing dry until your health care provider says that it can be removed. After your dressing is off, check your incision area every day for signs of infection. Check for: Redness, swelling, or pain. Fluid or blood. Warmth. Pus or a bad smell. General instructions Do not take baths, swim, or use a hot tub until your health  care provider approves. Ask your health care provider if you may take showers. You may only be allowed to take sponge baths. Your health care provider may ask you to avoid contact sports or activities that take a lot of effort. Do not do anything that stretches or puts pressure on your incision. You can return to your normal diet. Keep all follow-up visits. This is important. Contact a health care provider if: You have a fever. You have redness, swelling, or pain in the incision area. You have fluid or blood coming from your incision. You have pus or a bad smell coming from your incision. Your incision feels warm to the touch. Your cyst grows back. Get help right away if: If the incision site suddenly increases in  size and you have pain at the incision site. You may be checked for a collection of blood under the skin from the procedure (hematoma). Summary After the procedure, it is common to have soreness in the area where your cyst was removed. Take or apply over-the-counter and prescription medicines only as told by your health care provider. Follow instructions from your health care provider about how to take care of your incision. This information is not intended to replace advice given to you by your health care provider. Make sure you discuss any questions you have with your health care provider. Document Revised: 03/11/2020 Document Reviewed: 03/11/2020 Elsevier Patient Education  [place] After This sheet gives you information about how to care for yourself after your procedure. Your health care provider may also give you more specific instructions. If you have problems or questions, contact your health care provider. What can I expect after the procedure? After the procedure, it is common to have: Tiredness. Forgetfulness about what happened after the procedure. Impaired judgment for important decisions. Nausea or vomiting. Some difficulty with balance. Follow these instructions at home: For the time period you were told by your health care provider:   Rest as needed. Do not participate in activities where you could fall or become injured. Do not drive or use machinery. Do not drink alcohol. Do not take sleeping pills or medicines that cause drowsiness. Do not make important decisions or sign legal documents. Do not take care of children on your own. Eating and drinking Follow the diet that is recommended by your health care provider. Drink enough fluid to keep your urine pale yellow. If you vomit: Drink water, juice, or soup when you can drink without vomiting. Make sure you have little or no nausea before eating solid foods. General  instructions Have a responsible adult stay with you for the time you are told. It is important to have someone help care for you until you are awake and alert. Take over-the-counter and prescription medicines only as told by your health care provider. If you have sleep apnea, surgery and certain medicines can increase your risk for breathing problems. Follow instructions from your health care provider about wearing your sleep device: Anytime you are sleeping, including during daytime naps. While taking prescription pain medicines, sleeping medicines, or medicines that make you drowsy. Avoid smoking. Keep all follow-up visits as told by your health care provider. This is important. Contact a health care provider if: You keep feeling nauseous or you keep vomiting. You feel light-headed. You are still sleepy or having trouble with balance after 24 hours. You develop a rash. You have a fever. You have redness or swelling around the IV site. Get  help right away if: You have trouble breathing. You have new-onset confusion at home. Summary For several hours after your procedure, you may feel tired. You may also be forgetful and have poor judgment. Have a responsible adult stay with you for the time you are told. It is important to have someone help care for you until you are awake and alert. Rest as told. Do not drive or operate machinery. Do not drink alcohol or take sleeping pills. Get help right away if you have trouble breathing, or if you suddenly become confused. This information is not intended to replace advice given to you by your health care provider. Make sure you discuss any questions you have with your health care provider. Document Revised: 08/20/2020 Document Reviewed: 11/07/2019 Elsevier Patient Education  2022 [place]. How to Use Chlorhexidine for Bathing Chlorhexidine gluconate (CHG) is a germ-killing (antiseptic) solution that is used to clean the skin. It can get rid of  the bacteria that normally live on the skin and can keep them away for about 24 hours. To clean your skin with CHG, you may be given: A CHG solution to use in the shower or as part of a sponge bath. A prepackaged cloth that contains CHG. Cleaning your skin with CHG may help lower the risk for infection: While you are staying in the intensive care unit of the hospital. If you have a vascular access, such as a central line, to provide short-term or long-term access to your veins. If you have a catheter to drain urine from your bladder. If you are on a ventilator. A ventilator is a machine that helps you breathe by moving air in and out of your lungs. After surgery. What are the risks? Risks of using CHG include: A skin reaction. Hearing loss, if CHG gets in your ears and you have a perforated eardrum. Eye injury, if CHG gets in your eyes and is not rinsed out. The CHG product catching fire. Make sure that you avoid smoking and flames after applying CHG to your skin. Do not use CHG: If you have a chlorhexidine allergy or have previously reacted to chlorhexidine. On babies younger than 75 months of age. How to use CHG solution Use CHG only as told by your health care provider, and follow the instructions on the label. Use the full amount of CHG as directed. Usually, this is one bottle. During a shower Follow these steps when using CHG solution during a shower (unless your health care provider gives you different instructions): Start the shower. Use your normal soap and shampoo to wash your face and hair. Turn off the shower or move out of the shower stream. Pour the CHG onto a clean washcloth. Do not use any type of brush or rough-edged sponge. Starting at your neck, lather your body down to your toes. Make sure you follow these instructions: If you will be having surgery, pay special attention to the part of your body where you will be having surgery. Scrub this area for at least 1  minute. Do not use CHG on your head or face. If the solution gets into your ears or eyes, rinse them well with water. Avoid your genital area. Avoid any areas of skin that have broken skin, cuts, or scrapes. Scrub your back and under your arms. Make sure to wash skin folds. Let the lather sit on your skin for 1-2 minutes or as long as told by your health care provider. Thoroughly rinse your entire body in  the shower. Make sure that all body creases and crevices are rinsed well. Dry off with a clean towel. Do not put any substances on your body afterward--such as powder, lotion, or perfume--unless you are told to do so by your health care provider. Only use lotions that are recommended by the manufacturer. Put on clean clothes or pajamas. If it is the night before your surgery, sleep in clean sheets.  During a sponge bath Follow these steps when using CHG solution during a sponge bath (unless your health care provider gives you different instructions): Use your normal soap and shampoo to wash your face and hair. Pour the CHG onto a clean washcloth. Starting at your neck, lather your body down to your toes. Make sure you follow these instructions: If you will be having surgery, pay special attention to the part of your body where you will be having surgery. Scrub this area for at least 1 minute. Do not use CHG on your head or face. If the solution gets into your ears or eyes, rinse them well with water. Avoid your genital area. Avoid any areas of skin that have broken skin, cuts, or scrapes. Scrub your back and under your arms. Make sure to wash skin folds. Let the lather sit on your skin for 1-2 minutes or as long as told by your health care provider. Using a different clean, wet washcloth, thoroughly rinse your entire body. Make sure that all body creases and crevices are rinsed well. Dry off with a clean towel. Do not put any substances on your body afterward--such as powder, lotion, or  perfume--unless you are told to do so by your health care provider. Only use lotions that are recommended by the manufacturer. Put on clean clothes or pajamas. If it is the night before your surgery, sleep in clean sheets. How to use CHG prepackaged cloths Only use CHG cloths as told by your health care provider, and follow the instructions on the label. Use the CHG cloth on clean, dry skin. Do not use the CHG cloth on your head or face unless your health care provider tells you to. When washing with the CHG cloth: Avoid your genital area. Avoid any areas of skin that have broken skin, cuts, or scrapes. Before surgery Follow these steps when using a CHG cloth to clean before surgery (unless your health care provider gives you different instructions): Using the CHG cloth, vigorously scrub the part of your body where you will be having surgery. Scrub using a back-and-forth motion for 3 minutes. The area on your body should be completely wet with CHG when you are done scrubbing. Do not rinse. Discard the cloth and let the area air-dry. Do not put any substances on the area afterward, such as powder, lotion, or perfume. Put on clean clothes or pajamas. If it is the night before your surgery, sleep in clean sheets.  For general bathing Follow these steps when using CHG cloths for general bathing (unless your health care provider gives you different instructions). Use a separate CHG cloth for each area of your body. Make sure you wash between any folds of skin and between your fingers and toes. Wash your body in the following order, switching to a new cloth after each step: The front of your neck, shoulders, and chest. Both of your arms, under your arms, and your hands. Your stomach and groin area, avoiding the genitals. Your right leg and foot. Your left leg and foot. The back of your neck,  your back, and your buttocks. Do not rinse. Discard the cloth and let the area air-dry. Do not put any  substances on your body afterward--such as powder, lotion, or perfume--unless you are told to do so by your health care provider. Only use lotions that are recommended by the manufacturer. Put on clean clothes or pajamas. Contact a health care provider if: Your skin gets irritated after scrubbing. You have questions about using your solution or cloth. You swallow any chlorhexidine. Call your local poison control center (1-(601) 843-0587 in the [place] Get help right away if: Your eyes itch badly, or they become very red or swollen. Your skin itches badly and is red or swollen. Your hearing changes. You have trouble seeing. You have swelling or tingling in your mouth or throat. You have trouble breathing. These symptoms may represent a serious problem that is an emergency. Do not wait to see if the symptoms will go away. Get medical help right away. Call your local emergency services (911 in the [place] Do not drive yourself to the hospital. Summary Chlorhexidine gluconate (CHG) is a germ-killing (antiseptic) solution that is used to clean the skin. Cleaning your skin with CHG may help to lower your risk for infection. You may be given CHG to use for bathing. It may be in a bottle or in a prepackaged cloth to use on your skin. Carefully follow your health care provider's instructions and the instructions on the product label. Do not use CHG if you have a chlorhexidine allergy. Contact your health care provider if your skin gets irritated after scrubbing. This information is not intended to replace advice given to you by your health care provider. Make sure you discuss any questions you have with your health care provider. Document Revised: 02/15/2021 Document Reviewed: 02/15/2021 Elsevier Patient Education  2022 [employer].

## 2021-12-09 ENCOUNTER — Encounter (HOSPITAL_COMMUNITY): Payer: Self-pay

## 2021-12-09 ENCOUNTER — Encounter (HOSPITAL_COMMUNITY)
Admission: RE | Admit: 2021-12-09 | Discharge: 2021-12-09 | Disposition: A | Discharge status: Home / Self Care | Payer: BC Managed Care – PPO | Source: Ambulatory Visit | Attending: General Surgery | Admitting: General Surgery

## 2021-12-09 ENCOUNTER — Other Ambulatory Visit: Payer: Self-pay

## 2021-12-15 ENCOUNTER — Ambulatory Visit (HOSPITAL_COMMUNITY)
Admission: RE | Admit: 2021-12-15 | Discharge: 2021-12-15 | Disposition: A | Discharge status: Home / Self Care | Payer: BC Managed Care – PPO | Source: Ambulatory Visit | Attending: General Surgery | Admitting: General Surgery

## 2021-12-15 ENCOUNTER — Ambulatory Visit (HOSPITAL_COMMUNITY): Payer: BC Managed Care – PPO | Admitting: Certified Registered"

## 2021-12-15 ENCOUNTER — Encounter (HOSPITAL_COMMUNITY): Payer: Self-pay | Admitting: General Surgery

## 2021-12-15 ENCOUNTER — Encounter (HOSPITAL_COMMUNITY)
Admission: RE | Disposition: A | Discharge status: Home / Self Care | Payer: Self-pay | Source: Ambulatory Visit | Attending: General Surgery

## 2021-12-15 DIAGNOSIS — L728 Other follicular cysts of the skin and subcutaneous tissue: Secondary | ICD-10-CM | POA: Diagnosis not present

## 2021-12-15 DIAGNOSIS — L720 Epidermal cyst: Secondary | ICD-10-CM | POA: Diagnosis not present

## 2021-12-15 DIAGNOSIS — D487 Neoplasm of uncertain behavior of other specified sites: Secondary | ICD-10-CM | POA: Insufficient documentation

## 2021-12-15 DIAGNOSIS — L723 Sebaceous cyst: Secondary | ICD-10-CM

## 2021-12-15 DIAGNOSIS — D481 Neoplasm of uncertain behavior of connective and other soft tissue: Secondary | ICD-10-CM | POA: Diagnosis not present

## 2021-12-15 HISTORY — PX: EXCISION MASS HEAD: SHX6702

## 2021-12-15 HISTORY — PX: MASS EXCISION: SHX2000

## 2021-12-15 SURGERY — EXCISION, MASS, HEAD
Anesthesia: General | Site: Leg Lower | Laterality: Left

## 2021-12-15 MED ORDER — CHLORHEXIDINE GLUCONATE CLOTH 2 % EX PADS: 6.0000 | MEDICATED_PAD | Freq: Once | CUTANEOUS | Status: DC

## 2021-12-15 MED ORDER — CEFAZOLIN SODIUM-DEXTROSE 2-4 GM/100ML-% IV SOLN
2.0000 g | INTRAVENOUS | Status: AC
  Administered 2021-12-15: 10:00:00 2 g via INTRAVENOUS
  Filled 2021-12-15: qty 100

## 2021-12-15 MED ORDER — LIDOCAINE HCL (PF) 2 % IJ SOLN
INTRAMUSCULAR | Status: AC
  Filled 2021-12-15: qty 10

## 2021-12-15 MED ORDER — CHLORHEXIDINE GLUCONATE 0.12 % MT SOLN
OROMUCOSAL | Status: AC
  Administered 2021-12-15: 10:00:00 15 mL
  Filled 2021-12-15: qty 15

## 2021-12-15 MED ORDER — ONDANSETRON HCL 4 MG/2ML IJ SOLN
INTRAMUSCULAR | Status: DC | PRN
  Administered 2021-12-15: 4 mg via INTRAVENOUS

## 2021-12-15 MED ORDER — LIDOCAINE 2% (20 MG/ML) 5 ML SYRINGE
INTRAMUSCULAR | Status: DC | PRN
  Administered 2021-12-15: 100 mg via INTRAVENOUS

## 2021-12-15 MED ORDER — CHLORHEXIDINE GLUCONATE 0.12 % MT SOLN: 15.0000 mL | Freq: Once | OROMUCOSAL | Status: AC

## 2021-12-15 MED ORDER — KETOROLAC TROMETHAMINE 30 MG/ML IJ SOLN: 30.0000 mg | Freq: Once | INTRAMUSCULAR | Status: DC

## 2021-12-15 MED ORDER — HYDROCODONE-ACETAMINOPHEN 5-325 MG PO TABS: 1.0000 | ORAL_TABLET | ORAL | 0 refills | Status: DC | PRN

## 2021-12-15 MED ORDER — PHENYLEPHRINE 40 MCG/ML (10ML) SYRINGE FOR IV PUSH (FOR BLOOD PRESSURE SUPPORT)
PREFILLED_SYRINGE | INTRAVENOUS | Status: AC
  Filled 2021-12-15: qty 10

## 2021-12-15 MED ORDER — LACTATED RINGERS IV SOLN: INTRAVENOUS | Status: DC

## 2021-12-15 MED ORDER — ONDANSETRON HCL 4 MG/2ML IJ SOLN: 4.0000 mg | Freq: Once | INTRAMUSCULAR | Status: DC | PRN

## 2021-12-15 MED ORDER — MIDAZOLAM HCL 2 MG/2ML IJ SOLN
INTRAMUSCULAR | Status: AC
  Filled 2021-12-15: qty 2

## 2021-12-15 MED ORDER — DEXAMETHASONE SODIUM PHOSPHATE 10 MG/ML IJ SOLN
INTRAMUSCULAR | Status: DC | PRN
  Administered 2021-12-15: 5 mg via INTRAVENOUS

## 2021-12-15 MED ORDER — FENTANYL CITRATE (PF) 100 MCG/2ML IJ SOLN
INTRAMUSCULAR | Status: DC | PRN
  Administered 2021-12-15 (×2): 25 ug via INTRAVENOUS

## 2021-12-15 MED ORDER — BUPIVACAINE HCL (PF) 0.5 % IJ SOLN
INTRAMUSCULAR | Status: DC | PRN
  Administered 2021-12-15: 7 mL

## 2021-12-15 MED ORDER — PROPOFOL 10 MG/ML IV BOLUS
INTRAVENOUS | Status: AC
  Filled 2021-12-15: qty 20

## 2021-12-15 MED ORDER — 0.9 % SODIUM CHLORIDE (POUR BTL) OPTIME
TOPICAL | Status: DC | PRN
  Administered 2021-12-15: 11:00:00 1000 mL

## 2021-12-15 MED ORDER — PHENYLEPHRINE 40 MCG/ML (10ML) SYRINGE FOR IV PUSH (FOR BLOOD PRESSURE SUPPORT)
PREFILLED_SYRINGE | INTRAVENOUS | Status: DC | PRN
  Administered 2021-12-15 (×2): 80 ug via INTRAVENOUS

## 2021-12-15 MED ORDER — FENTANYL CITRATE (PF) 100 MCG/2ML IJ SOLN
INTRAMUSCULAR | Status: AC
  Filled 2021-12-15: qty 2

## 2021-12-15 MED ORDER — MIDAZOLAM HCL 5 MG/5ML IJ SOLN
INTRAMUSCULAR | Status: DC | PRN
  Administered 2021-12-15: 2 mg via INTRAVENOUS

## 2021-12-15 MED ORDER — FENTANYL CITRATE PF 50 MCG/ML IJ SOSY: 25.0000 ug | PREFILLED_SYRINGE | INTRAMUSCULAR | Status: DC | PRN

## 2021-12-15 MED ORDER — BUPIVACAINE HCL (PF) 0.5 % IJ SOLN
INTRAMUSCULAR | Status: AC
  Filled 2021-12-15: qty 30

## 2021-12-15 MED ORDER — ORAL CARE MOUTH RINSE: 15.0000 mL | Freq: Once | OROMUCOSAL | Status: AC

## 2021-12-15 MED ORDER — PROPOFOL 10 MG/ML IV BOLUS
INTRAVENOUS | Status: DC | PRN
  Administered 2021-12-15: 200 mg via INTRAVENOUS

## 2021-12-15 SURGICAL SUPPLY — 33 items
APL PRP STRL LF ISPRP CHG 10.5 (MISCELLANEOUS) ×4
APPLICATOR CHLORAPREP 10.5 ORG (MISCELLANEOUS) ×6 IMPLANT
BLADE SURG SZ11 CARB STEEL (BLADE) IMPLANT
CLOTH BEACON ORANGE TIMEOUT ST (SAFETY) ×4 IMPLANT
COVER LIGHT HANDLE STERIS (MISCELLANEOUS) ×8 IMPLANT
DECANTER SPIKE VIAL GLASS SM (MISCELLANEOUS) ×4 IMPLANT
DRAPE EENT ADH APERT 31X51 STR (DRAPES) ×2 IMPLANT
DRSG TEGADERM 2-3/8X2-3/4 SM (GAUZE/BANDAGES/DRESSINGS) ×4 IMPLANT
DRSG TELFA 3X8 NADH (GAUZE/BANDAGES/DRESSINGS) ×4 IMPLANT
ELECT REM PT RETURN 9FT ADLT (ELECTROSURGICAL) ×4
ELECTRODE REM PT RTRN 9FT ADLT (ELECTROSURGICAL) ×2 IMPLANT
GLOVE SURG POLYISO LF SZ7.5 (GLOVE) ×4 IMPLANT
GLOVE SURG UNDER POLY LF SZ7 (GLOVE) ×8 IMPLANT
GOWN STRL REUS W/TWL LRG LVL3 (GOWN DISPOSABLE) ×8 IMPLANT
KIT TURNOVER KIT A (KITS) ×4 IMPLANT
MANIFOLD NEPTUNE II (INSTRUMENTS) ×4 IMPLANT
NDL HYPO 25X1 1.5 SAFETY (NEEDLE) ×2 IMPLANT
NEEDLE HYPO 25X1 1.5 SAFETY (NEEDLE) ×4 IMPLANT
NS IRRIG 1000ML POUR BTL (IV SOLUTION) ×4 IMPLANT
PACK MINOR (CUSTOM PROCEDURE TRAY) ×4 IMPLANT
PAD ARMBOARD 7.5X6 YLW CONV (MISCELLANEOUS) ×4 IMPLANT
PAD DRESSING TELFA 3X8 NADH (GAUZE/BANDAGES/DRESSINGS) IMPLANT
SET BASIN LINEN APH (SET/KITS/TRAYS/PACK) ×4 IMPLANT
SPONGE GAUZE 2X2 8PLY STER LF (GAUZE/BANDAGES/DRESSINGS) ×2
SPONGE GAUZE 2X2 8PLY STRL LF (GAUZE/BANDAGES/DRESSINGS) ×2 IMPLANT
SUT ETHILON 3 0 FSL (SUTURE) IMPLANT
SUT ETHILON 4 0 P 3 18 (SUTURE) ×2 IMPLANT
SUT MNCRL AB 4-0 PS2 18 (SUTURE) IMPLANT
SUT PROLENE 3 0 PS 1 (SUTURE) IMPLANT
SUT PROLENE 4 0 PS 2 18 (SUTURE) ×6 IMPLANT
SUT VIC AB 3-0 SH 27 (SUTURE)
SUT VIC AB 3-0 SH 27X BRD (SUTURE) IMPLANT
SYR CONTROL 10ML LL (SYRINGE) ×4 IMPLANT

## 2021-12-15 NOTE — Anesthesia Preprocedure Evaluation (Signed)
Anesthesia Evaluation  Patient identified by MRN, date of birth, ID band Patient awake    Reviewed: Allergy & Precautions, H&P , NPO status , Patient's Chart, lab work & pertinent test results, reviewed documented beta blocker date and time   Airway Mallampati: II  TM Distance: >3 FB Neck ROM: full    Dental no notable dental hx.    Pulmonary neg pulmonary ROS,    Pulmonary exam normal breath sounds clear to auscultation       Cardiovascular Exercise Tolerance: Good negative cardio ROS   Rhythm:regular Rate:Normal     Neuro/Psych negative neurological ROS  negative psych ROS   GI/Hepatic negative GI ROS, Neg liver ROS,   Endo/Other  Morbid obesity  Renal/GU negative Renal ROS  negative genitourinary   Musculoskeletal   Abdominal   Peds  Hematology negative hematology ROS (+)   Anesthesia Other Findings   Reproductive/Obstetrics negative OB ROS                             Anesthesia Physical Anesthesia Plan  ASA: 2  Anesthesia Plan: General   Post-op Pain Management:    Induction:   PONV Risk Score and Plan: Propofol infusion  Airway Management Planned:   Additional Equipment:   Intra-op Plan:   Post-operative Plan:   Informed Consent: I have reviewed the patients History and Physical, chart, labs and discussed the procedure including the risks, benefits and alternatives for the proposed anesthesia with the patient or authorized representative who has indicated his/her understanding and acceptance.     Dental Advisory Given  Plan Discussed with: CRNA  Anesthesia Plan Comments:         Anesthesia Quick Evaluation

## 2021-12-15 NOTE — Anesthesia Procedure Notes (Signed)
Procedure Name: LMA Insertion Date/Time: 12/15/2021 10:21 AM Performed by: Myna Bright, CRNA Pre-anesthesia Checklist: Patient identified, Emergency Drugs available, Suction available and Patient being monitored Patient Re-evaluated:Patient Re-evaluated prior to induction Oxygen Delivery Method: Circle system utilized Preoxygenation: Pre-oxygenation with 100% oxygen Induction Type: IV induction Ventilation: Mask ventilation without difficulty LMA: LMA inserted LMA Size: 5.0 Tube type: Oral Number of attempts: 1 Placement Confirmation: positive ETCO2 and breath sounds checked- equal and bilateral Tube secured with: Tape Dental Injury: Teeth and Oropharynx as per pre-operative assessment

## 2021-12-15 NOTE — Interval H&P Note (Signed)
History and Physical Interval Note:  12/15/2021 9:52 AM  Jeffery Orozco  has presented today for surgery, with the diagnosis of Sebaceous cyst, scalp Sebaceous cyst, left leg.  The various methods of treatment have been discussed with the patient and family. After consideration of risks, benefits and other options for treatment, the patient has consented to  Procedure(s): EXCISION CYST;SCALP; HEAD (N/A) EXCISION CYST; LEFT LEG (Left) as a surgical intervention.  The patient's history has been reviewed, patient examined, no change in status, stable for surgery.  I have reviewed the patient's chart and labs.  Questions were answered to the patient's satisfaction.     Aviva Signs

## 2021-12-15 NOTE — Transfer of Care (Signed)
Immediate Anesthesia Transfer of Care Note  Patient: Jeffery Orozco  Procedure(s) Performed: EXCISION CYST;SCALP; HEAD (Left: Head) EXCISION CYST; LEFT LEG (Left: Leg Lower)  Patient Location: PACU  Anesthesia Type:General  Level of Consciousness: sedated, patient cooperative and responds to stimulation  Airway & Oxygen Therapy: Patient Spontanous Breathing and Patient connected to nasal cannula oxygen  Post-op Assessment: Report given to RN and Post -op Vital signs reviewed and stable  Post vital signs: Reviewed and stable  Last Vitals:  Vitals Value Taken Time  BP    Temp    Pulse 70 12/15/21 1122  Resp 12 12/15/21 1122  SpO2 98 % 12/15/21 1122  Vitals shown include unvalidated device data.  Last Pain:  Vitals:   12/15/21 0841  TempSrc: Oral  PainSc: 0-No pain         Complications: No notable events documented.

## 2021-12-15 NOTE — Anesthesia Postprocedure Evaluation (Signed)
Anesthesia Post Note  Patient: Jeffery Orozco  Procedure(s) Performed: EXCISION CYST;SCALP; HEAD (Left: Head) EXCISION CYST; LEFT LEG (Left: Leg Lower)  Patient location during evaluation: Phase II Anesthesia Type: General Level of consciousness: awake Pain management: pain level controlled Vital Signs Assessment: post-procedure vital signs reviewed and stable Respiratory status: spontaneous breathing and respiratory function stable Cardiovascular status: blood pressure returned to baseline and stable Postop Assessment: no headache and no apparent nausea or vomiting Anesthetic complications: no Comments: Late entry   No notable events documented.   Last Vitals:  Vitals:   12/15/21 1145 12/15/21 1159  BP: 116/83 119/77  Pulse: 72 60  Resp: 12 14  Temp:  36.4 C  SpO2: 100% 98%    Last Pain:  Vitals:   12/15/21 1159  TempSrc: Oral  PainSc: 0-No pain                 Louann Sjogren

## 2021-12-15 NOTE — Op Note (Signed)
Patient:  Jeffery Orozco  DOB:  [birth year]  MRN:  [id number]   Preop Diagnosis: Sebaceous cyst of scalp, subcutaneous cyst left lower extremity  Postop Diagnosis: Same  Procedure: Excision of sebaceous cyst, scalp Excision of subcutaneous cyst, left lower extremity  Surgeon: Aviva Signs, MD  Anes: General  Indications: Patient is a 50 year old black male who presents with a sebaceous cyst on his scalp as well as a cystic lesion in the subcutaneous tissue in the posterior lower extremity over the calf muscle.  The risks and benefits of both procedures including bleeding, infection, and recurrence were fully explained to the patient, who gave informed consent.  Procedure note: The patient was placed in supine position.  After general anesthesia was administered, the top of the head and the left lower extremity were prepped and draped using the usual sterile technique with ChloraPrep.  Surgical site confirmation was performed.  Elliptical incision was made around the punctum of the sebaceous cyst on the scalp.  The cyst was excised without difficulty.  It was sent to pathology for further examination.  It measures approximately 1.5 cm in its greatest diameter.  A bleeding was controlled using Bovie electrocautery.  0.5% Sensorcaine was instilled into the surrounding wound.  The skin was reapproximated using a 4-0 nylon interrupted suture.  Betadine ointment and dry sterile dressing were applied.  Next, the cyst on the posterior lower extremity was excised.  Elliptical incision was made as some of the skin was noted to be involved.  It appeared to be a hard cystic lesion with a gelatinous material on the inside of the cyst.  It was difficult to a certain whether this was coming off the fascia.  It also appeared to be a possible foreign body reaction.  The tissue was excised and sent to pathology for further examination.  This was in the subcutaneous tissue above the muscle plane.  A bleeding was  controlled using Bovie electrocautery.  0.5% Sensorcaine was instilled into the surrounding wound.  The skin was reapproximated using a 3-0 nylon interrupted suture.  Betadine ointment and dry sterile dressing were applied.  All tape and needle counts were correct at the end of the procedure.  The patient was awakened and transferred to PACU in stable condition.  Complications: None  EBL: Minimal  Specimen: Sebaceous cyst, scalp Soft tissue cyst, left lower extremity

## 2021-12-16 ENCOUNTER — Encounter (HOSPITAL_COMMUNITY): Payer: Self-pay | Admitting: General Surgery

## 2021-12-23 ENCOUNTER — Other Ambulatory Visit: Payer: Self-pay

## 2021-12-23 ENCOUNTER — Ambulatory Visit (INDEPENDENT_AMBULATORY_CARE_PROVIDER_SITE_OTHER): Payer: BC Managed Care – PPO | Admitting: General Surgery

## 2021-12-23 ENCOUNTER — Encounter: Payer: Self-pay | Admitting: General Surgery

## 2021-12-23 VITALS — BP 122/76 | HR 70 | Temp 98.3°F | Resp 16 | Ht 72.0 in | Wt 267.0 lb

## 2021-12-23 DIAGNOSIS — Z09 Encounter for follow-up examination after completed treatment for conditions other than malignant neoplasm: Secondary | ICD-10-CM

## 2021-12-23 NOTE — Progress Notes (Signed)
Subjective:     Jeffery Orozco  Here for postoperative visit, status post excision of cyst on scalp and left lower extremity.  Patient has no complaints. Objective:    BP 122/76    Pulse 70    Temp 98.3 F (36.8 C) (Oral)    Resp 16    Ht 6' (1.829 m)    Wt 267 lb (121.1 kg)    SpO2 96%    BMI 36.21 kg/m   General:  alert, cooperative, and no distress  Scalp incision well-healed.  Sutures removed. Left lower extremity wound healing well.  One half of sutures removed. Final pathology pending.     Assessment:    Doing well postoperatively.    Plan:   Follow-up here in 5 days for remaining suture removal.

## 2021-12-28 ENCOUNTER — Encounter: Payer: Self-pay | Admitting: General Surgery

## 2021-12-28 ENCOUNTER — Ambulatory Visit (INDEPENDENT_AMBULATORY_CARE_PROVIDER_SITE_OTHER): Payer: BC Managed Care – PPO | Admitting: General Surgery

## 2021-12-28 ENCOUNTER — Other Ambulatory Visit: Payer: Self-pay

## 2021-12-28 VITALS — BP 123/83 | HR 61 | Temp 97.9°F | Resp 16 | Ht 72.0 in | Wt 276.0 lb

## 2021-12-28 DIAGNOSIS — Z09 Encounter for follow-up examination after completed treatment for conditions other than malignant neoplasm: Secondary | ICD-10-CM

## 2021-12-29 NOTE — Progress Notes (Signed)
Subjective:     Jeffery Orozco  Here for wound check.  Patient has no complaints. Objective:    BP 123/83    Pulse 61    Temp 97.9 F (36.6 C) (Other (Comment))    Resp 16    Ht 6' (1.829 m)    Wt 276 lb (125.2 kg)    SpO2 97%    BMI 37.43 kg/m   General:  alert, cooperative, and no distress  Left lower extremity wound healing well.  Sutures removed. Final pathology of leg wound still pending.     Assessment:    Doing well postoperatively. Final pathology of the left leg mass pending    Plan:   Pathology did contact me and state that they are doing more studies and getting a second opinion concerning the left lower extremity wound.  This was told to the patient.  I will call him when the final pathology report is made.

## 2022-01-07 ENCOUNTER — Encounter (HOSPITAL_COMMUNITY): Payer: Self-pay

## 2022-01-07 LAB — SURGICAL PATHOLOGY

## 2022-01-11 ENCOUNTER — Telehealth (INDEPENDENT_AMBULATORY_CARE_PROVIDER_SITE_OTHER): Payer: BC Managed Care – PPO | Admitting: General Surgery

## 2022-01-11 DIAGNOSIS — Z09 Encounter for follow-up examination after completed treatment for conditions other than malignant neoplasm: Secondary | ICD-10-CM

## 2022-01-11 NOTE — Telephone Encounter (Signed)
Final pathology results reported to the patient.  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  I did tell the patient that there is a 20% chance of recurrence.  Should that occur, he was instructed to return to my office for a wider excision.  He understands and agrees.  All questions were answered.

## 2022-03-16 DIAGNOSIS — Z1331 Encounter for screening for depression: Secondary | ICD-10-CM | POA: Diagnosis not present

## 2022-03-16 DIAGNOSIS — E669 Obesity, unspecified: Secondary | ICD-10-CM | POA: Diagnosis not present

## 2022-03-16 DIAGNOSIS — E7849 Other hyperlipidemia: Secondary | ICD-10-CM | POA: Diagnosis not present

## 2022-03-16 DIAGNOSIS — Z0001 Encounter for general adult medical examination with abnormal findings: Secondary | ICD-10-CM | POA: Diagnosis not present

## 2022-03-16 DIAGNOSIS — Z6836 Body mass index (BMI) 36.0-36.9, adult: Secondary | ICD-10-CM | POA: Diagnosis not present

## 2022-03-16 DIAGNOSIS — K635 Polyp of colon: Secondary | ICD-10-CM | POA: Diagnosis not present

## 2022-03-16 DIAGNOSIS — E782 Mixed hyperlipidemia: Secondary | ICD-10-CM | POA: Diagnosis not present

## 2023-03-20 DIAGNOSIS — R001 Bradycardia, unspecified: Secondary | ICD-10-CM | POA: Diagnosis not present

## 2023-03-20 DIAGNOSIS — E782 Mixed hyperlipidemia: Secondary | ICD-10-CM | POA: Diagnosis not present

## 2023-03-20 DIAGNOSIS — E6609 Other obesity due to excess calories: Secondary | ICD-10-CM | POA: Diagnosis not present

## 2023-03-20 DIAGNOSIS — Z1331 Encounter for screening for depression: Secondary | ICD-10-CM | POA: Diagnosis not present

## 2023-03-20 DIAGNOSIS — E7849 Other hyperlipidemia: Secondary | ICD-10-CM | POA: Diagnosis not present

## 2023-03-20 DIAGNOSIS — Z6833 Body mass index (BMI) 33.0-33.9, adult: Secondary | ICD-10-CM | POA: Diagnosis not present

## 2023-03-20 DIAGNOSIS — Z0001 Encounter for general adult medical examination with abnormal findings: Secondary | ICD-10-CM | POA: Diagnosis not present

## 2023-03-20 DIAGNOSIS — K635 Polyp of colon: Secondary | ICD-10-CM | POA: Diagnosis not present

## 2023-11-20 DIAGNOSIS — K640 First degree hemorrhoids: Secondary | ICD-10-CM | POA: Diagnosis not present

## 2023-11-20 DIAGNOSIS — Z23 Encounter for immunization: Secondary | ICD-10-CM | POA: Diagnosis not present

## 2023-11-20 DIAGNOSIS — Z6835 Body mass index (BMI) 35.0-35.9, adult: Secondary | ICD-10-CM | POA: Diagnosis not present

## 2023-11-20 DIAGNOSIS — E6609 Other obesity due to excess calories: Secondary | ICD-10-CM | POA: Diagnosis not present

## 2023-12-02 NOTE — Progress Notes (Unsigned)
Referring Provider: Assunta Found, MD Primary Care Physician:  Assunta Found, MD Primary Gastroenterologist:  Dr. Jena Gauss  No chief complaint on file.   HPI:   Jeffery Orozco is a 52 y.o. male presenting today at the request of Assunta Found, MD for colon cancer screening.  Reviewed office visit with Dr. Phillips Odor 11/20/2023.  Patient reporting 3 to 4 weeks of anal fullness, itching, pain, and rectal bleeding.  Diagnosis with grade 1 hemorrhoids.  He was prescribed Anusol rectal suppositories and Anusol cream.  He was also referred to GI for screening colonoscopy.   Last colonoscopy on file 02/10/2016 with minimal anal canal hemorrhoids suspected to be source of hematochezia, 1 diminutive polyp in the rectum, splenic flexure, cecum.  All pathology was benign.  Recommended screening colonoscopy in 10 years.  Today:   Past Medical History:  Diagnosis Date   Hypercholesteremia     Past Surgical History:  Procedure Laterality Date   COLONOSCOPY N/A 02/10/2016   Procedure: COLONOSCOPY;  Surgeon: Corbin Ade, MD;  Location: [facility];  Service: Endoscopy;  Laterality: N/A;  100   EXCISION MASS HEAD Left 12/15/2021   Procedure: EXCISION CYST;SCALP; HEAD;  Surgeon: Franky Macho, MD;  Location: [facility];  Service: General;  Laterality: Left;   INGUINAL HERNIA REPAIR Right 08/16/2013   Procedure: HERNIA REPAIR INGUINAL ADULT;  Surgeon: Fabio Bering, MD;  Location: [facility];  Service: General;  Laterality: Right;   MASS EXCISION Left 12/15/2021   Procedure: EXCISION CYST; LEFT LEG;  Surgeon: Franky Macho, MD;  Location: [facility];  Service: General;  Laterality: Left;   SEPTOPLASTY N/A 11/17/2015   Procedure: SEPTOPLASTY;  Surgeon: Newman Pies, MD;  Location: [facility];  Service: ENT;  Laterality: N/A;   SINUS ENDO WITH FUSION Left 11/17/2015   Procedure: LEFT ENDOSCOPIC TOTAL ETHMOIDECTOMY, LEFT ENDOSCOPIC MAXILLARY ANTROSTOMY, LEFT ENDOSCOPIC FRONTAL RECESS EXPLORATION WITH  FUSION NAVIGATION;  Surgeon: Newman Pies, MD;  Location: [facility];  Service: ENT;  Laterality: Left;   VASECTOMY      Current Outpatient Medications  Medication Sig Dispense Refill   rosuvastatin (CRESTOR) 10 MG tablet Take 10 mg by mouth at bedtime.     No current facility-administered medications for this visit.    Allergies as of 12/04/2023   (No Known Allergies)    Family History  Problem Relation Age of Onset   Colon cancer Neg Hx     Social History   Socioeconomic History   Marital status: Married    Spouse name: Not on file   Number of children: Not on file   Years of education: Not on file   Highest education level: Not on file  Occupational History   Not on file  Tobacco Use   Smoking status: Never   Smokeless tobacco: Never  Vaping Use   Vaping status: Never Used  Substance and Sexual Activity   Alcohol use: No    Alcohol/week: 0.0 standard drinks of alcohol   Drug use: No   Sexual activity: Yes    Birth control/protection: None  Other Topics Concern   Not on file  Social History Narrative   Not on file   Social Drivers of Health   Financial Resource Strain: Not on file  Food Insecurity: Not on file  Transportation Needs: Not on file  Physical Activity: Not on file  Stress: Not on file  Social Connections: Not on file  Intimate Partner Violence: Not on file  Review of Systems: Gen: Denies any fever, chills, fatigue, weight loss, lack of appetite.  CV: Denies chest pain, heart palpitations, peripheral edema, syncope.  Resp: Denies shortness of breath at rest or with exertion. Denies wheezing or cough.  GI: Denies dysphagia or odynophagia. Denies jaundice, hematemesis, fecal incontinence. GU : Denies urinary burning, urinary frequency, urinary hesitancy MS: Denies joint pain, muscle weakness, cramps, or limitation of movement.  Derm: Denies rash, itching, dry skin Psych: Denies depression, anxiety, memory loss, and  confusion Heme: Denies bruising, bleeding, and enlarged lymph nodes.  Physical Exam: There were no vitals taken for this visit. General:   Alert and oriented. Pleasant and cooperative. Well-nourished and well-developed.  Head:  Normocephalic and atraumatic. Eyes:  Without icterus, sclera clear and conjunctiva pink.  Ears:  Normal auditory acuity. Lungs:  Clear to auscultation bilaterally. No wheezes, rales, or rhonchi. No distress.  Heart:  S1, S2 present without murmurs appreciated.  Abdomen:  +BS, soft, non-tender and non-distended. No HSM noted. No guarding or rebound. No masses appreciated.  Rectal:  Deferred  Msk:  Symmetrical without gross deformities. Normal posture. Extremities:  Without edema. Neurologic:  Alert and  oriented x4;  grossly normal neurologically. Skin:  Intact without significant lesions or rashes. Psych:  Alert and cooperative. Normal mood and affect.    Assessment:     Plan:  ***   Ermalinda Memos, PA-C [facility] 12/04/2023

## 2023-12-04 ENCOUNTER — Encounter (INDEPENDENT_AMBULATORY_CARE_PROVIDER_SITE_OTHER): Payer: Self-pay

## 2023-12-04 ENCOUNTER — Ambulatory Visit: Payer: BC Managed Care – PPO | Admitting: Gastroenterology

## 2023-12-04 ENCOUNTER — Encounter: Payer: Self-pay | Admitting: Gastroenterology

## 2023-12-04 VITALS — BP 115/75 | HR 61 | Temp 97.3°F | Ht 72.0 in | Wt 262.4 lb

## 2023-12-04 DIAGNOSIS — Z860100 Personal history of colon polyps, unspecified: Secondary | ICD-10-CM | POA: Diagnosis not present

## 2023-12-04 DIAGNOSIS — K5900 Constipation, unspecified: Secondary | ICD-10-CM | POA: Diagnosis not present

## 2023-12-04 DIAGNOSIS — Z1211 Encounter for screening for malignant neoplasm of colon: Secondary | ICD-10-CM

## 2023-12-04 DIAGNOSIS — K649 Unspecified hemorrhoids: Secondary | ICD-10-CM

## 2023-12-04 DIAGNOSIS — Z8719 Personal history of other diseases of the digestive system: Secondary | ICD-10-CM | POA: Diagnosis not present

## 2023-12-04 NOTE — Patient Instructions (Addendum)
To help with bowel regularity, start Benefiber 3 teaspoons daily for 2 weeks, then increase to twice daily.  Try to drink at least 64 ounces of water daily.  Consume plenty of fruits, vegetables, whole grains to maintain adequate dietary fiber intake.  You are due for colonoscopy in February 2027.  You are on recall for this and should receive a letter closer to time.  I will make sure that we send your colonoscopy report to Dr. Phillips Odor.  We will see back as needed.  It was good to see you today!  I hope you have a fantastic Christmas and New Year!  Ermalinda Memos, PA-C [facility]

## 2024-03-20 DIAGNOSIS — K635 Polyp of colon: Secondary | ICD-10-CM | POA: Diagnosis not present

## 2024-03-20 DIAGNOSIS — E7849 Other hyperlipidemia: Secondary | ICD-10-CM | POA: Diagnosis not present

## 2024-03-20 DIAGNOSIS — E782 Mixed hyperlipidemia: Secondary | ICD-10-CM | POA: Diagnosis not present

## 2024-03-20 DIAGNOSIS — Z0000 Encounter for general adult medical examination without abnormal findings: Secondary | ICD-10-CM | POA: Diagnosis not present

## 2024-03-20 DIAGNOSIS — K640 First degree hemorrhoids: Secondary | ICD-10-CM | POA: Diagnosis not present

## 2024-03-20 DIAGNOSIS — Z1331 Encounter for screening for depression: Secondary | ICD-10-CM | POA: Diagnosis not present

## 2024-03-20 DIAGNOSIS — Z6835 Body mass index (BMI) 35.0-35.9, adult: Secondary | ICD-10-CM | POA: Diagnosis not present

## 2024-03-20 DIAGNOSIS — E6609 Other obesity due to excess calories: Secondary | ICD-10-CM | POA: Diagnosis not present

## 2024-06-05 DIAGNOSIS — Z6836 Body mass index (BMI) 36.0-36.9, adult: Secondary | ICD-10-CM | POA: Diagnosis not present

## 2024-06-05 DIAGNOSIS — R7309 Other abnormal glucose: Secondary | ICD-10-CM | POA: Diagnosis not present

## 2024-06-05 DIAGNOSIS — G5603 Carpal tunnel syndrome, bilateral upper limbs: Secondary | ICD-10-CM | POA: Diagnosis not present

## 2024-06-05 DIAGNOSIS — E6609 Other obesity due to excess calories: Secondary | ICD-10-CM | POA: Diagnosis not present

## 2025-03-31 ENCOUNTER — Ambulatory Visit: Admitting: Family Medicine
